# Patient Record
Sex: Female | Born: 1990 | Hispanic: Yes | Marital: Married | Smoking: Never smoker
Health system: Southern US, Community
[De-identification: ages and names within clinical notes are randomized; demographics above are authoritative.]

## PROBLEM LIST (undated history)

## (undated) DIAGNOSIS — Z789 Other specified health status: Secondary | ICD-10-CM

## (undated) DIAGNOSIS — T7840XA Allergy, unspecified, initial encounter: Secondary | ICD-10-CM

## (undated) DIAGNOSIS — K219 Gastro-esophageal reflux disease without esophagitis: Secondary | ICD-10-CM

## (undated) DIAGNOSIS — F419 Anxiety disorder, unspecified: Secondary | ICD-10-CM

## (undated) DIAGNOSIS — O039 Complete or unspecified spontaneous abortion without complication: Secondary | ICD-10-CM

## (undated) HISTORY — DX: Complete or unspecified spontaneous abortion without complication: O03.9

## (undated) HISTORY — DX: Allergy, unspecified, initial encounter: T78.40XA

## (undated) HISTORY — DX: Anxiety disorder, unspecified: F41.9

## (undated) HISTORY — PX: APPENDECTOMY: SHX54

---

## 2016-10-12 LAB — OB RESULTS CONSOLE ABO/RH: RH Type: POSITIVE

## 2016-10-12 LAB — OB RESULTS CONSOLE RUBELLA ANTIBODY, IGM: RUBELLA: IMMUNE

## 2016-10-12 LAB — OB RESULTS CONSOLE ANTIBODY SCREEN: ANTIBODY SCREEN: NEGATIVE

## 2016-10-12 LAB — OB RESULTS CONSOLE GC/CHLAMYDIA
CHLAMYDIA, DNA PROBE: NEGATIVE
Gonorrhea: NEGATIVE

## 2016-10-12 LAB — OB RESULTS CONSOLE HIV ANTIBODY (ROUTINE TESTING): HIV: NONREACTIVE

## 2016-10-12 LAB — OB RESULTS CONSOLE HEPATITIS B SURFACE ANTIGEN: HEP B S AG: NEGATIVE

## 2017-02-19 LAB — OB RESULTS CONSOLE RPR: RPR: NONREACTIVE

## 2017-04-13 LAB — OB RESULTS CONSOLE GBS: GBS: NEGATIVE

## 2017-05-01 ENCOUNTER — Inpatient Hospital Stay (HOSPITAL_COMMUNITY): Payer: BLUE CROSS/BLUE SHIELD | Admitting: Anesthesiology

## 2017-05-01 ENCOUNTER — Other Ambulatory Visit: Payer: Self-pay

## 2017-05-01 ENCOUNTER — Inpatient Hospital Stay (HOSPITAL_COMMUNITY)
Admission: AD | Admit: 2017-05-01 | Discharge: 2017-05-04 | DRG: 806 | Disposition: A | Discharge status: Home / Self Care | Payer: BLUE CROSS/BLUE SHIELD | Source: Ambulatory Visit | Attending: Obstetrics and Gynecology | Admitting: Obstetrics and Gynecology

## 2017-05-01 ENCOUNTER — Encounter (HOSPITAL_COMMUNITY): Payer: Self-pay | Admitting: *Deleted

## 2017-05-01 DIAGNOSIS — O4292 Full-term premature rupture of membranes, unspecified as to length of time between rupture and onset of labor: Secondary | ICD-10-CM | POA: Diagnosis present

## 2017-05-01 DIAGNOSIS — O9081 Anemia of the puerperium: Secondary | ICD-10-CM | POA: Diagnosis not present

## 2017-05-01 DIAGNOSIS — D62 Acute posthemorrhagic anemia: Secondary | ICD-10-CM | POA: Diagnosis not present

## 2017-05-01 DIAGNOSIS — Z3A39 39 weeks gestation of pregnancy: Secondary | ICD-10-CM | POA: Diagnosis not present

## 2017-05-01 HISTORY — DX: Other specified health status: Z78.9

## 2017-05-01 LAB — CBC
HEMATOCRIT: 37.4 % (ref 36.0–46.0)
Hemoglobin: 12.9 g/dL (ref 12.0–15.0)
MCH: 32.4 pg (ref 26.0–34.0)
MCHC: 34.5 g/dL (ref 30.0–36.0)
MCV: 94 fL (ref 78.0–100.0)
Platelets: 240 10*3/uL (ref 150–400)
RBC: 3.98 MIL/uL (ref 3.87–5.11)
RDW: 12.5 % (ref 11.5–15.5)
WBC: 11.7 10*3/uL — ABNORMAL HIGH (ref 4.0–10.5)

## 2017-05-01 LAB — TYPE AND SCREEN
ABO/RH(D): O POS
ANTIBODY SCREEN: NEGATIVE

## 2017-05-01 MED ORDER — LIDOCAINE HCL (PF) 1 % IJ SOLN
30.0000 mL | INTRAMUSCULAR | Status: DC | PRN
  Filled 2017-05-01: qty 30

## 2017-05-01 MED ORDER — EPHEDRINE 5 MG/ML INJ
10.0000 mg | INTRAVENOUS | Status: DC | PRN
  Filled 2017-05-01: qty 2

## 2017-05-01 MED ORDER — OXYCODONE-ACETAMINOPHEN 5-325 MG PO TABS: 1.0000 | ORAL_TABLET | ORAL | Status: DC | PRN

## 2017-05-01 MED ORDER — TERBUTALINE SULFATE 1 MG/ML IJ SOLN
0.2500 mg | Freq: Once | INTRAMUSCULAR | Status: DC | PRN
  Filled 2017-05-01: qty 1

## 2017-05-01 MED ORDER — LACTATED RINGERS IV SOLN
500.0000 mL | Freq: Once | INTRAVENOUS | Status: AC
  Administered 2017-05-01: 500 mL via INTRAVENOUS

## 2017-05-01 MED ORDER — OXYCODONE-ACETAMINOPHEN 5-325 MG PO TABS: 2.0000 | ORAL_TABLET | ORAL | Status: DC | PRN

## 2017-05-01 MED ORDER — LACTATED RINGERS IV SOLN: 500.0000 mL | INTRAVENOUS | Status: DC | PRN

## 2017-05-01 MED ORDER — OXYTOCIN BOLUS FROM INFUSION
500.0000 mL | Freq: Once | INTRAVENOUS | Status: AC
  Administered 2017-05-02: 500 mL via INTRAVENOUS

## 2017-05-01 MED ORDER — OXYTOCIN 40 UNITS IN LACTATED RINGERS INFUSION - SIMPLE MED
2.5000 [IU]/h | INTRAVENOUS | Status: DC
  Filled 2017-05-01: qty 1000

## 2017-05-01 MED ORDER — ACETAMINOPHEN 325 MG PO TABS: 650.0000 mg | ORAL_TABLET | ORAL | Status: DC | PRN

## 2017-05-01 MED ORDER — LACTATED RINGERS IV SOLN
INTRAVENOUS | Status: DC
  Administered 2017-05-01 – 2017-05-02 (×3): via INTRAVENOUS

## 2017-05-01 MED ORDER — PHENYLEPHRINE 40 MCG/ML (10ML) SYRINGE FOR IV PUSH (FOR BLOOD PRESSURE SUPPORT)
80.0000 ug | PREFILLED_SYRINGE | INTRAVENOUS | Status: DC | PRN
  Filled 2017-05-01: qty 5
  Filled 2017-05-01: qty 10

## 2017-05-01 MED ORDER — DIPHENHYDRAMINE HCL 50 MG/ML IJ SOLN: 12.5000 mg | INTRAMUSCULAR | Status: DC | PRN

## 2017-05-01 MED ORDER — OXYTOCIN 40 UNITS IN LACTATED RINGERS INFUSION - SIMPLE MED
1.0000 m[IU]/min | INTRAVENOUS | Status: DC
  Administered 2017-05-01: 2 m[IU]/min via INTRAVENOUS

## 2017-05-01 MED ORDER — SOD CITRATE-CITRIC ACID 500-334 MG/5ML PO SOLN: 30.0000 mL | ORAL | Status: DC | PRN

## 2017-05-01 MED ORDER — FENTANYL 2.5 MCG/ML BUPIVACAINE 1/10 % EPIDURAL INFUSION (WH - ANES)
14.0000 mL/h | INTRAMUSCULAR | Status: DC | PRN
  Administered 2017-05-01 – 2017-05-02 (×2): 14 mL/h via EPIDURAL
  Filled 2017-05-01 (×2): qty 100

## 2017-05-01 MED ORDER — ONDANSETRON HCL 4 MG/2ML IJ SOLN: 4.0000 mg | Freq: Four times a day (QID) | INTRAMUSCULAR | Status: DC | PRN

## 2017-05-01 MED ORDER — PHENYLEPHRINE 40 MCG/ML (10ML) SYRINGE FOR IV PUSH (FOR BLOOD PRESSURE SUPPORT)
80.0000 ug | PREFILLED_SYRINGE | INTRAVENOUS | Status: DC | PRN
  Filled 2017-05-01: qty 5

## 2017-05-01 MED ORDER — FLEET ENEMA 7-19 GM/118ML RE ENEM: 1.0000 | ENEMA | RECTAL | Status: DC | PRN

## 2017-05-01 MED ORDER — LIDOCAINE HCL (PF) 1 % IJ SOLN
INTRAMUSCULAR | Status: DC | PRN
  Administered 2017-05-01 (×2): 5 mL

## 2017-05-01 NOTE — Anesthesia Pain Management Evaluation Note (Signed)
  CRNA Pain Management Visit Note  Patient: Rosalita ChessmanMaria Mendoza Caballero, 27 y.o., female  "Hello I am a member of the anesthesia team at [facility]. We have an anesthesia team available at all times to provide care throughout the hospital, including epidural management and anesthesia for C-section. I don't know your plan for the delivery whether it a natural birth, water birth, IV sedation, nitrous supplementation, doula or epidural, but we want to meet your pain goals."   1.Was your pain managed to your expectations on prior hospitalizations?   No prior hospitalizations  2.What is your expectation for pain management during this hospitalization?     Epidural  3.How can we help you reach that goal? Epidural  Record the patient's initial score and the patient's pain goal.   Pain: 2  Pain Goal: 5 [facility] wants you to be able to say your pain was always managed very well.  Shelle Galdamez 05/01/2017

## 2017-05-01 NOTE — H&P (Signed)
Daisy ChessmanMaria Mendoza Ford is a 27 y.o. female G1P0 at 6539.0 wga presenting for srom at 1500 today, clear fluid and no bleeding.  She has had an uncomplicated antepartum course and notes +FM. [facility] at [facility] since 10 wks History OB History    Gravida Para Term Preterm AB Living   1             SAB TAB Ectopic Multiple Live Births                 Past Medical History:  Diagnosis Date  . Medical history non-contributory    Past Surgical History:  Procedure Laterality Date  . APPENDECTOMY     Family History: family history is not on file. Social History:  reports that  has never smoked. she has never used smokeless tobacco. She reports that she does not drink alcohol or use drugs.  ROS  Dilation: 1.5 Effacement (%): 100 Station: -2 Exam by:: e. poore, rnc Blood pressure 112/72, pulse 74, temperature 97.9 F (36.6 C), temperature source Oral, resp. rate 17, height 5\' 5"  (1.651 m), weight 154 lb (69.9 kg). Exam Physical Exam  Prenatal labs: ABO, Rh: --/--/O POS (02/23 1946) Antibody: NEG (02/23 1946) Rubella: Immune (08/06 0000) RPR: Nonreactive (12/14 0000)  HBsAg: Negative (08/06 0000)  HIV: Non-reactive (08/06 0000)  GBS: Negative (02/05 0000)  1 hr Glucola - passed Genetic screening ; nml quad Anatomy US: normal  Physical Exam:  A&O x 3 HEENT : grossly wnl Lungs : ctab CV : rrr Abdo : soft, nt, gravid; efw 7lbs Extr : no edema, nt bilat LE Pelvic : 2-3/70/-3, midposition to posterior  Results for orders placed or performed during the hospital encounter of 05/01/17 (from the past 24 hour(s))  CBC     Status: Abnormal   Collection Time: 05/01/17  7:46 PM  Result Value Ref Range   WBC 11.7 (H) 4.0 - 10.5 K/uL   RBC 3.98 3.87 - 5.11 MIL/uL   Hemoglobin 12.9 12.0 - 15.0 g/dL   HCT 62.937.4 52.836.0 - 41.346.0 %   MCV 94.0 78.0 - 100.0 fL   MCH 32.4 26.0 - 34.0 pg   MCHC 34.5 30.0 - 36.0 g/dL   RDW 24.412.5 01.011.5 - 27.215.5 %   Platelets 240 150 - 400 K/uL  Type and screen  [facility]     Status: None   Collection Time: 05/01/17  7:46 PM  Result Value Ref Range   ABO/RH(D) O POS    Antibody Screen NEG    Sample Expiration      05/04/2017 Performed at [facility], [address] [place], [place] [address]    FHT: 120s, normal variability, +accels, no decels TOCO: initially q 4, irregular; now more q 2-3   Assessment/Plan: iup at 39 wga 1. SROM - admit and continue pitocin augmentation; wants epidural; plan svd 2. gbs neg 3. Rh pos 4. Rubella immune   Vick FreesSusan E Saafir Abdullah 05/01/2017, 10:42 PM

## 2017-05-01 NOTE — MAU Provider Note (Signed)
   History   [id number]   Chief Complaint  Patient presents with  . Rupture of Membranes    HPI Daisy Ford is a 27 y.o. female  G1P0 @39 .0 wks here with report of LOF, clear around 1500.  Leaking of fluid has continued. Pt reports rare contractions. She denies vaginal bleeding. Last intercourse was 2 days ago. She reports good fetal movement. All other systems negative.    No LMP recorded. Patient is pregnant.  OB History  Gravida Para Term Preterm AB Living  1            SAB TAB Ectopic Multiple Live Births               # Outcome Date GA Lbr Len/2nd Weight Sex Delivery Anes PTL Lv  1 Current               Past Medical History:  Diagnosis Date  . Medical history non-contributory     History reviewed. No pertinent family history.  Social History   Socioeconomic History  . Marital status: Single    Spouse name: None  . Number of children: None  . Years of education: None  . Highest education level: None  Social Needs  . Financial resource strain: None  . Food insecurity - worry: None  . Food insecurity - inability: None  . Transportation needs - medical: None  . Transportation needs - non-medical: None  Occupational History  . None  Tobacco Use  . Smoking status: Never Smoker  . Smokeless tobacco: Never Used  Substance and Sexual Activity  . Alcohol use: No    Frequency: Never  . Drug use: No  . Sexual activity: Yes  Other Topics Concern  . None  Social History Narrative  . None    Allergies  Allergen Reactions  . Caffeine Palpitations    No current facility-administered medications on file prior to encounter.    No current outpatient medications on file prior to encounter.     Review of Systems  Gastrointestinal: Negative for abdominal pain.  Genitourinary: Positive for vaginal discharge.     Physical Exam   Vitals:   05/01/17 1830  BP: 132/83  Pulse: 86  Resp: 18  Temp: 98.7 F (37.1 C)  TempSrc: Oral     Physical Exam  Nursing note and vitals reviewed. Constitutional: She is oriented to person, place, and time. She appears well-developed and well-nourished. No distress.  HENT:  Head: Normocephalic and atraumatic.  Neck: Normal range of motion.  Cardiovascular: Normal rate.  Respiratory: No respiratory distress.  Genitourinary:  Genitourinary Comments: SSE: +pool, fern pos  Musculoskeletal: Normal range of motion.  Neurological: She is alert and oriented to person, place, and time.  Skin: Skin is warm and dry.  Psychiatric: She has a normal mood and affect.  EFM: 135 bpm, mod variability, + accels, no decels Toco: 4-6  MAU Course  Procedures  MDM PROM at term confirmed. Plan for admit.  Assessment and Plan   1. Full-term premature rupture of membranes, unspecified duration to onset of labor   2. [redacted] weeks gestation of pregnancy    [facility] Dr. Wallene DalesAlmquist   Namrata Dangler, AnnandaleMelanie, CNM 05/01/2017 7:06 PM

## 2017-05-01 NOTE — Anesthesia Preprocedure Evaluation (Signed)

## 2017-05-01 NOTE — Anesthesia Procedure Notes (Signed)
Epidural Patient location during procedure: OB  Staffing Anesthesiologist: Ottis Sarnowski, MD Performed: anesthesiologist   Preanesthetic Checklist Completed: patient identified, site marked, surgical consent, pre-op evaluation, timeout performed, IV checked, risks and benefits discussed and monitors and equipment checked  Epidural Patient position: sitting Prep: DuraPrep Patient monitoring: heart rate, continuous pulse ox and blood pressure Approach: right paramedian Location: L3-L4 Injection technique: LOR saline  Needle:  Needle type: Tuohy  Needle gauge: 17 G Needle length: 9 cm and 9 Needle insertion depth: 6 cm Catheter type: closed end flexible Catheter size: 20 Guage Catheter at skin depth: 10 cm Test dose: negative  Assessment Events: blood not aspirated, injection not painful, no injection resistance, negative IV test and no paresthesia  Additional Notes Patient identified. Risks/Benefits/Options discussed with patient including but not limited to bleeding, infection, nerve damage, paralysis, failed block, incomplete pain control, headache, blood pressure changes, nausea, vomiting, reactions to medication both or allergic, itching and postpartum back pain. Confirmed with bedside nurse the patient's most recent platelet count. Confirmed with patient that they are not currently taking any anticoagulation, have any bleeding history or any family history of bleeding disorders. Patient expressed understanding and wished to proceed. All questions were answered. Sterile technique was used throughout the entire procedure. Please see nursing notes for vital signs. Test dose was given through epidural needle and negative prior to continuing to dose epidural or start infusion. Warning signs of high block given to the patient including shortness of breath, tingling/numbness in hands, complete motor block, or any concerning symptoms with instructions to call for help. Patient was given  instructions on fall risk and not to get out of bed. All questions and concerns addressed with instructions to call with any issues.     

## 2017-05-01 NOTE — MAU Note (Signed)
Pt presents to MAU with c/o PROM. Pt felt small gush of fluid today around 1530, and has had leaking since then along with abdominal cramping. Pt denies VB. +FM

## 2017-05-02 ENCOUNTER — Encounter (HOSPITAL_COMMUNITY): Payer: Self-pay

## 2017-05-02 LAB — RPR: RPR: NONREACTIVE

## 2017-05-02 LAB — ABO/RH: ABO/RH(D): O POS

## 2017-05-02 MED ORDER — DIPHENHYDRAMINE HCL 25 MG PO CAPS: 25.0000 mg | ORAL_CAPSULE | Freq: Four times a day (QID) | ORAL | Status: DC | PRN

## 2017-05-02 MED ORDER — ONDANSETRON HCL 4 MG/2ML IJ SOLN: 4.0000 mg | INTRAMUSCULAR | Status: DC | PRN

## 2017-05-02 MED ORDER — OXYCODONE-ACETAMINOPHEN 5-325 MG PO TABS
1.0000 | ORAL_TABLET | ORAL | Status: DC | PRN
  Administered 2017-05-03: 1 via ORAL
  Filled 2017-05-02: qty 1

## 2017-05-02 MED ORDER — IBUPROFEN 600 MG PO TABS
600.0000 mg | ORAL_TABLET | Freq: Four times a day (QID) | ORAL | Status: DC
  Administered 2017-05-02 – 2017-05-04 (×6): 600 mg via ORAL
  Filled 2017-05-02 (×7): qty 1

## 2017-05-02 MED ORDER — WITCH HAZEL-GLYCERIN EX PADS: 1.0000 "application " | MEDICATED_PAD | CUTANEOUS | Status: DC | PRN

## 2017-05-02 MED ORDER — OXYCODONE-ACETAMINOPHEN 5-325 MG PO TABS: 2.0000 | ORAL_TABLET | ORAL | Status: DC | PRN

## 2017-05-02 MED ORDER — ACETAMINOPHEN 325 MG PO TABS
650.0000 mg | ORAL_TABLET | ORAL | Status: DC | PRN
  Administered 2017-05-04: 650 mg via ORAL
  Filled 2017-05-02: qty 2

## 2017-05-02 MED ORDER — SIMETHICONE 80 MG PO CHEW: 80.0000 mg | CHEWABLE_TABLET | ORAL | Status: DC | PRN

## 2017-05-02 MED ORDER — COCONUT OIL OIL
1.0000 "application " | TOPICAL_OIL | Status: DC | PRN
  Filled 2017-05-02: qty 120

## 2017-05-02 MED ORDER — SENNOSIDES-DOCUSATE SODIUM 8.6-50 MG PO TABS
2.0000 | ORAL_TABLET | ORAL | Status: DC
  Administered 2017-05-02 – 2017-05-04 (×2): 2 via ORAL
  Filled 2017-05-02 (×2): qty 2

## 2017-05-02 MED ORDER — PRENATAL MULTIVITAMIN CH
1.0000 | ORAL_TABLET | Freq: Every day | ORAL | Status: DC
  Administered 2017-05-03: 1 via ORAL
  Filled 2017-05-02: qty 1

## 2017-05-02 MED ORDER — BENZOCAINE-MENTHOL 20-0.5 % EX AERO
1.0000 "application " | INHALATION_SPRAY | CUTANEOUS | Status: DC | PRN
  Administered 2017-05-03: 1 via TOPICAL
  Filled 2017-05-02: qty 56

## 2017-05-02 MED ORDER — ONDANSETRON HCL 4 MG PO TABS: 4.0000 mg | ORAL_TABLET | ORAL | Status: DC | PRN

## 2017-05-02 MED ORDER — DIBUCAINE 1 % RE OINT
1.0000 "application " | TOPICAL_OINTMENT | RECTAL | Status: DC | PRN
  Administered 2017-05-03: 1 via RECTAL
  Filled 2017-05-02: qty 28

## 2017-05-02 NOTE — Progress Notes (Signed)
Delivery Note At  a viable and healthy female was delivered over intact perineum via svd (Presentation: ; roa ).  APGAR: , 9/9; weight: not yet done  .   Placenta status: delivered, intact .  Cord: 3vv  with the following complications: none.  Anesthesia:  epidural Episiotomy:  none Lacerations:  2nd degree; bilat vaginal with left labial; left vaginal repair initially not hemostatic and blood expressed from incision - further repair with 3-0 vicryl and then hemostatic, no blood expressed and not expanding; attention then drawn to perineum which was then repaired; left laceration then re-examined and hemostatic still Suture Repair: 3.0 vicryl Est. Blood Loss (mL):  400ml  Mom to postpartum.  Baby to Couplet care / Skin to Skin.  Vick FreesSusan E Almquist 05/02/2017, 2:23 PM

## 2017-05-02 NOTE — Lactation Note (Signed)
This note was copied from a baby's chart. Lactation Consultation Note  Patient Name: Daisy Ford Today's Date: 05/02/2017 Reason for consult: Initial assessment;Primapara;1st time breastfeeding;Term  5 hours old infant who is still being exclusively BF by his mother, her feeding choice upon admission was to BF and bottle fed, she's a P1. RN called for assistance because mom had requested formula already, spoke to mom about the consequences of formula feeding and size of newborn stomach.  Taught mom how to hand express, and she got reassured when she was able to express 3 ml of colostrum and LC fed it back to baby, taught parents how to spoon feed baby. After baby was done, assessed latch, put him STS on mother's breast (parents had baby on 2 blankets) and he was able to latch with very little effort and sustain the latch; he was still nursing when exiting the room, at least a 15 minutes feeding.  Spoke to parents about the importance of STS contact and encouraged them to feed baby on cues instead of on schedule at least 8-12 times/24 hours. Reviewed BF brochure (SP), BF resources and feeding diary, mom is aware of LC services and will call PRN.  Maternal Data Formula Feeding for Exclusion: Yes Reason for exclusion: Mother's choice to formula and breast feed on admission Has patient been taught Hand Expression?: Yes Does the patient have breastfeeding experience prior to this delivery?: No  Feeding Feeding Type: Breast Fed Length of feed: 15 min(baby still feeding when exiting the room)  LATCH Score Latch: Grasps breast easily, tongue down, lips flanged, rhythmical sucking.  Audible Swallowing: A few with stimulation  Type of Nipple: Everted at rest and after stimulation  Comfort (Breast/Nipple): Soft / non-tender  Hold (Positioning): Assistance needed to correctly position infant at breast and maintain latch.  LATCH Score: 8  Interventions Interventions: Breast  feeding basics reviewed;Assisted with latch;Skin to skin;Breast massage;Breast compression;Adjust position;Position options;Support pillows;Expressed milk  Lactation Tools Discussed/Used WIC Program: No   Consult Status Consult Status: Follow-up Date: 05/03/17 Follow-up type: In-patient    Glenda Spelman Venetia ConstableS Deannie Resetar 05/02/2017, 6:11 PM

## 2017-05-02 NOTE — Anesthesia Postprocedure Evaluation (Signed)
Anesthesia Post Note  Patient: Daisy ChessmanMaria Mendoza Ford  Procedure(s) Performed: AN AD HOC LABOR EPIDURAL     Patient location during evaluation: Mother Baby Anesthesia Type: Epidural Level of consciousness: awake Pain management: pain level controlled Vital Signs Assessment: post-procedure vital signs reviewed and stable Respiratory status: spontaneous breathing Cardiovascular status: stable Postop Assessment: no headache, no backache, epidural receding, patient able to bend at knees, no apparent nausea or vomiting and adequate PO intake Anesthetic complications: no    Last Vitals:  Vitals:   05/02/17 1500 05/02/17 1515  BP: 113/69 111/75  Pulse: 89 96  Resp: 18 18  Temp: 37.1 C   SpO2:      Last Pain:  Vitals:   05/02/17 1500  TempSrc: Oral  PainSc:    Pain Goal: Patients Stated Pain Goal: 5 (05/01/17 2019)               Marieke Lubke

## 2017-05-02 NOTE — Progress Notes (Signed)
Comfortable with epidural  Patient Vitals for the past 24 hrs:  BP Temp Temp src Pulse Resp SpO2 Height Weight  05/02/17 1000 113/67 - - 75 16 - - -  05/02/17 0930 119/75 98.3 F (36.8 C) Oral 73 16 - - -  05/02/17 0900 120/87 - - 92 16 - - -  05/02/17 0830 101/62 - - 77 16 - - -  05/02/17 0800 111/78 - - 74 16 - - -  05/02/17 0730 111/60 - - 62 16 - - -  05/02/17 0700 102/65 - - 69 16 - - -  05/02/17 0631 91/63 97.8 F (36.6 C) Oral 70 17 - - -  05/02/17 0601 102/61 - - 81 16 - - -  05/02/17 0531 (!) 99/58 - - 84 16 - - -  05/02/17 0501 112/68 - - 65 16 - - -  05/02/17 0431 98/61 97.6 F (36.4 C) Oral 66 16 - - -  05/02/17 0400 (!) 106/91 - - 77 16 - - -  05/02/17 0331 (!) 109/51 - - 78 16 - - -  05/02/17 0301 103/60 - - 93 16 - - -  05/02/17 0231 106/60 - - 82 17 - - -  05/02/17 0219 - 97.8 F (36.6 C) Oral - - - - -  05/02/17 0200 115/73 - - 73 16 - - -  05/02/17 0131 109/77 - - 72 16 - - -  05/02/17 0100 110/71 - - 72 17 - - -  05/02/17 0030 105/68 - - 87 16 - - -  05/02/17 0000 118/72 97.9 F (36.6 C) Oral 75 17 - - -  05/01/17 2350 - - - - - 98 % - -  05/01/17 2345 - - - - - 98 % - -  05/01/17 2340 113/76 - - 78 16 98 % - -  05/01/17 2335 111/83 - - 72 16 99 % - -  05/01/17 2330 118/70 - - 74 17 99 % - -  05/01/17 2325 110/72 - - 73 17 98 % - -  05/01/17 2320 116/74 - - 77 17 98 % - -  05/01/17 2315 125/73 - - 70 17 98 % - -  05/01/17 2311 121/69 - - 76 17 - - -  05/01/17 2310 - - - - - 98 % - -  05/01/17 2308 129/76 - - 79 17 - - -  05/01/17 2305 - - - - - 99 % - -  05/01/17 2304 122/63 - - 92 17 - - -  05/01/17 2227 123/77 - - 76 17 - - -  05/01/17 2153 - 97.9 F (36.6 C) Oral - - - - -  05/01/17 2152 112/72 - - 74 17 - - -  05/01/17 2118 113/71 - - 82 17 - - -  05/01/17 2017 - - - - - - 5\' 5"  (1.651 m) 154 lb (69.9 kg)  05/01/17 2006 112/73 98.5 F (36.9 C) Oral 84 16 - - -  05/01/17 1830 132/83 98.7 F (37.1 C) Oral 86 18 - - -   A&ox3 nml  respirations Abd: soft, nt, gravid Cx: 4/90/-1, cephalic; iupc placed LE: no edema, nt bilat  Fht: 130s, nml variability, +accelerations, no decelerations Toco: q1-2 min  A/p: iup at 39.1 1. srom - contin pitocin augmentation, appears to be approaching active labor; plan svd 2. Fetal status reassuring 3. gbs negative 4. Rh pos

## 2017-05-02 NOTE — Progress Notes (Signed)
Very comfortable with epidural  BP 118/80   Pulse 93   Temp 98.6 F (37 C) (Oral)   Resp 16   Ht 5\' 5"  (1.651 m)   Wt 154 lb (69.9 kg)   SpO2 98%   BMI 25.63 kg/m   A&ox3 nml respirations Abd: soft, nt, gravid Cx: c/c/+2 LE: no edema, nt bilat  FHT: 130s, normal variability, occ period of decreased variability; accels; few early decels, 1 late decel (about 40 sec) TOCO: q 2 min  A/p: iup at 39.1 1. Active labor - begin pushing , anticipate svd 2. Fetal status reassuring 3. gbs neg

## 2017-05-03 LAB — CBC
HCT: 30.9 % — ABNORMAL LOW (ref 36.0–46.0)
HEMOGLOBIN: 10.7 g/dL — AB (ref 12.0–15.0)
MCH: 32.5 pg (ref 26.0–34.0)
MCHC: 34.6 g/dL (ref 30.0–36.0)
MCV: 93.9 fL (ref 78.0–100.0)
Platelets: 188 10*3/uL (ref 150–400)
RBC: 3.29 MIL/uL — AB (ref 3.87–5.11)
RDW: 12.7 % (ref 11.5–15.5)
WBC: 15.9 10*3/uL — AB (ref 4.0–10.5)

## 2017-05-03 MED ORDER — POLYSACCHARIDE IRON COMPLEX 150 MG PO CAPS
150.0000 mg | ORAL_CAPSULE | Freq: Every day | ORAL | Status: DC
  Filled 2017-05-03: qty 1

## 2017-05-03 NOTE — Progress Notes (Signed)
Pacific iterpereter used to discuss circumcision timeline: here while in hospital with her provider, after d/c with list to be provided, or after 1 year with urologist. FOB stated that they wanted to discuss it for a few days. RN stated that she would provide them with outpatient list. FOB stated ok

## 2017-05-03 NOTE — Progress Notes (Signed)
Pacific iterpereter used to discuss circumcision timeline: here while in hospital with her provider, after d/c with list to be provided, or after 1 year with urologist. FOB stated that they wanted to discuss it for a few days. RN stated that she would provide them with outpatient list. FOB stated ok 

## 2017-05-03 NOTE — Lactation Note (Signed)
This note was copied from a baby's chart. Lactation Consultation Note  Patient Name: Daisy Ford [id number]'WToday's Date: 05/03/2017 Reason for consult: Follow-up assessment   Kennyth Loseacifica Interpreter [phone] for Spanish. P1, Baby 25 hours old.   Mother denies problems or questions and states breastfeeding is going well. Discussed breastfeeding on both breasts per feeding and cluster feeding. Mother latched baby in cradle hold.  Taught how to flange bottom lip and bring him in close. Intermittent sucks and swallows observed. Suggest mother call if she needs further assistance.     Maternal Data    Feeding Feeding Type: Breast Fed Length of feed: 15 min  LATCH Score Latch: Grasps breast easily, tongue down, lips flanged, rhythmical sucking.  Audible Swallowing: A few with stimulation  Type of Nipple: Everted at rest and after stimulation  Comfort (Breast/Nipple): Soft / non-tender  Hold (Positioning): Assistance needed to correctly position infant at breast and maintain latch.  LATCH Score: 8  Interventions    Lactation Tools Discussed/Used     Consult Status Consult Status: Follow-up Date: 05/04/17 Follow-up type: In-patient    Dahlia ByesBerkelhammer, Harlea Goetzinger [facility] 05/03/2017, 2:49 PM

## 2017-05-03 NOTE — Progress Notes (Signed)
PPD #1, SVD, 2nd degree, bilateral vaginal, left labial repair, baby boy "Engineer, maintenance (IT)Christopher" Interpreter present for full visit  S:  Reports feeling good, but sore              Tolerating po/ No nausea or vomiting / Denies dizziness or SOB             Bleeding is moderate             Pain controlled with Motrin and Percocet              Up ad lib / ambulatory / voiding QS  Newborn breast feeding - reports latching is going well and feels comfortable with positioning  / Circumcision - desires; but planning to call insurance today to determine most cost effective option  O:               VS: BP 104/61 (BP Location: Right Arm)   Pulse 78   Temp 97.7 F (36.5 C) (Oral)   Resp 17   Ht 5\' 5"  (1.651 m)   Wt 69.9 kg (154 lb)   SpO2 100%   Breastfeeding? Unknown   BMI 25.63 kg/m    LABS:              Recent Labs    05/01/17 1946 05/03/17 0507  WBC 11.7* 15.9*  HGB 12.9 10.7*  PLT 240 188               Blood type: --/--/O POS, O POS Performed at [facility], [address] [place], [place] [address]  [phone](02/23 1946)  Rubella: Immune (08/06 0000)                     I&O: Intake/Output      02/24 0701 - 02/25 0700 02/25 0701 - 02/26 0700   Urine (mL/kg/hr) 750 (0.4)    Blood 430    Total Output 1180    Net -1180                       Physical Exam:             Alert and oriented X3  Lungs: Clear and unlabored  Heart: regular rate and rhythm / no murmurs  Abdomen: soft, non-tender, non-distended              Fundus: firm, non-tender, U-1  Perineum: well approximated 2nd degree, bilateral vaginal, and left labial repair; mild edema, no erythema or ecchymosis   Lochia: moderate, no clots  Extremities: no edema, no calf pain or tenderness    A: PPD # 1, SVD  2nd degree, bilateral vaginal, left labial repair - healing well   Mild ABL Anemia   Doing well - stable status  P: Routine post partum orders  Begin Niferex 150mg  daily  See lactation today   Call insurance for  circumcision cost   Anticipate discharge home tomorrow   Carlean JewsMeredith Merrin Mcvicker, MSN, CNM [facility]

## 2017-05-04 MED ORDER — SENNOSIDES-DOCUSATE SODIUM 8.6-50 MG PO TABS: 2.0000 | ORAL_TABLET | ORAL | 0 refills | Status: DC

## 2017-05-04 MED ORDER — IBUPROFEN 600 MG PO TABS: 600.0000 mg | ORAL_TABLET | Freq: Four times a day (QID) | ORAL | 0 refills | Status: DC

## 2017-05-04 MED ORDER — FERROUS SULFATE 325 (65 FE) MG PO TABS: 325.0000 mg | ORAL_TABLET | Freq: Every day | ORAL | 3 refills | Status: DC

## 2017-05-04 NOTE — Lactation Note (Signed)
This note was copied from a baby's chart. Lactation Consultation Note  Patient Name: Boy Rosalita ChessmanMaria Mendoza Caballero [id number]'UToday's Date: 05/04/2017   Jaci LazierPacifica Interpreter [phone] used for Spanish. Mother breastfeeding upon entering.  Sucks and swallows observed. Mother concerned about her milk supply because her baby breastfed frequently through the night.  Provided education regarding cluster feeding and how milk comes to volume. Reassured mother this is normal feeding behavior. Reviewed hand expression with mother who can easily express flow of transitional breastmilk.  Mother appears happy. Provided mother w/ manual pump and demonstrated use. Mom encouraged to feed baby 8-12 times/24 hours and with feeding cues.  Reviewed engorgement care and monitoring voids/stools.      Maternal Data    Feeding Feeding Type: Breast Fed Length of feed: 25 min  LATCH Score                   Interventions    Lactation Tools Discussed/Used     Consult Status      Hardie PulleyBerkelhammer, Isais Klipfel Boschen 05/04/2017, 10:23 AM

## 2017-05-04 NOTE — Progress Notes (Signed)
PPD #2, SVD, 2nd degree, bilateral vaginal, left labial repair, baby boy "Engineer, maintenance (IT)Christopher" Interpreter present for full visit  S:  Reports feeling good, but tired - states baby was cluster feeding all night and was inconsolable at times - expresses that she is interested in starting some formula             Tolerating po/ No nausea or vomiting / Denies dizziness or SOB             Bleeding is light             Pain controlled with Motrin             Up ad lib / ambulatory / voiding QS  Newborn breastfeeding - latching well, but reports sore nipples on right side  / Circumcision - planning out patient   O:               VS: BP 105/64   Pulse 85   Temp 97.9 F (36.6 C) (Oral)   Resp 18   Ht 5\' 5"  (1.651 m)   Wt 69.9 kg (154 lb)   SpO2 100%   Breastfeeding? Unknown   BMI 25.63 kg/m    LABS:              Recent Labs    05/01/17 1946 05/03/17 0507  WBC 11.7* 15.9*  HGB 12.9 10.7*  PLT 240 188               Blood type: --/--/O POS, O POS Performed at [facility], [address] [place], [place] [address]  [phone](02/23 1946)  Rubella: Immune (08/06 0000)                     I&O: Intake/Output      02/25 0701 - 02/26 0700 02/26 0701 - 02/27 0700   Urine (mL/kg/hr)     Blood     Total Output     Net                        Physical Exam:             Alert and oriented X3  Breast: right nipple mild erythema noted; no bleeding or cracking noted; left nipple WNL  Abdomen: soft, non-tender, non-distended              Fundus: firm, non-tender, U-3  Perineum: well approximated 2nd degree, bilateral vaginal, and left labial repair; mild edema, no erythema or ecchymosis   Lochia: scant, no clots   Extremities: no edema, no calf pain or tenderness    A: PPD # 2, SVD             2nd degree, bilateral vaginal, left labial repair - healing well              Mild ABL Anemia - stable on oral FE             Doing well - stable status  P: Routine post partum orders  RN notified that  patient desires to begin some formula supplementation, need for coconut oil and comfort gels, and [employer] nurse visit in 1 week  Discharge home today  [facility] discharge book and instructions reviewed via interpreter, Veryl SpeakEtta  Plans for circ outpatient   F/u [facility] Dr. Ernestina [facility]naFogleman [facility]  Carlean JewsMeredith Garreth Burnsworth, MSN, CNM [facility]

## 2017-05-04 NOTE — Discharge Summary (Signed)
Obstetric Discharge Summary   Patient Name: Daisy Ford DOB: Jun 28, 1990 MRN: [id number]  Date of Admission: 05/01/2017 Date of Discharge: 05/04/2017 Date of Delivery: 05/02/2017 Gestational Age at Delivery: [redacted]w[redacted]d  Primary OB: [facility] - Dr. Ernestina Penna   Antepartum complications: none; Spanish-speaking Prenatal Labs:  ABO, Rh: --/--/O POS (02/23 1946) Antibody: NEG (02/23 1946) Rubella: Immune (08/06 0000) RPR: Nonreactive (12/14 0000)  HBsAg: Negative (08/06 0000)  HIV: Non-reactive (08/06 0000)  GBS: Negative (02/05 0000)  1 hr Glucola - passed Genetic screening ; nml quad Anatomy US: normal  Admitting Diagnosis: SROM at term   Secondary Diagnoses: Patient Active Problem List   Diagnosis Date Noted  . SVD (spontaneous vaginal delivery) 05/03/2017  . Postpartum care following vaginal delivery (2/24) 05/03/2017  . Second degree perineal laceration 05/03/2017  . Normal labor 05/01/2017    Augmentation: Pitocin  Complications: None  Date of Delivery: 05/02/2017 Delivered By: Dr. Amado Nash Delivery Type: spontaneous vaginal delivery Anesthesia: epidural Placenta: sponatneous Laceration: 2nd degree; bilat vaginal with left labial; left vaginal  Episiotomy: none  Newborn Data: Live born female  Birth Weight: 7 lb 4.2 oz (3295 g) APGAR: 9, 9  Newborn Delivery   Birth date/time:  05/02/2017 13:06:00 Delivery type:  Vaginal, Spontaneous     Hospital/Postpartum Course  (Vaginal Delivery): Pt. Admitted with SROM at term.  She was augmented with Pitocin and progressed normally.  See delivery note for further details. Patient had an uncomplicated postpartum course.  By time of discharge on PPD#2, her pain was controlled on oral pain medications; she had appropriate lochia and was ambulating, voiding without difficulty and tolerating regular diet.  She was deemed stable for discharge to home.    Labs: CBC Latest Ref Rng & Units 05/03/2017 05/01/2017  WBC 4.0  - 10.5 K/uL 15.9(H) 11.7(H)  Hemoglobin 12.0 - 15.0 g/dL 10.7(L) 12.9  Hematocrit 36.0 - 46.0 % 30.9(L) 37.4  Platelets 150 - 400 K/uL 188 240   Conflict (See Lab Report): O POS/O POS Performed at [facility], [address] [place], [place] [address]   Physical exam:  BP 105/64   Pulse 85   Temp 97.9 F (36.6 C) (Oral)   Resp 18   Ht 5\' 5"  (1.651 m)   Wt 69.9 kg (154 lb)   SpO2 100%   Breastfeeding? Unknown   BMI 25.63 kg/m  General: alert and no distress Pulm: normal respiratory effort Lochia: appropriate Abdomen: soft, NT Uterine Fundus: firm, below umbilicus Perineum: healing well, no significant erythema, no significant edema Incision: c/d/i, healing well, no significant drainage, no dehiscence, no significant erythema Extremities: No evidence of DVT seen on physical exam. No lower extremity edema.  Disposition: stable, discharge to home Baby Feeding: breast milk and formula Baby Disposition: home with mom  Contraception: unsure  Rh Immune globulin given: N/A Rubella vaccine given: N/A Tdap vaccine given in AP or PP setting: UTD Flu vaccine given in AP or PP setting: UTD   Plan:  Daisy Ford was discharged to home in good condition. Follow-up appointment at [facility] in 6 weeks.  Discharge Instructions: Per After Visit Summary. Refer to After Visit Summary and [facility] discharge booklet. Interpreter used for discharge instructions.  Activity: Advance as tolerated. Pelvic rest for 6 weeks.   Diet: Regular, Heart Healthy Discharge Medications: Allergies as of 05/04/2017   No Known Allergies     Medication List    TAKE these medications   ferrous sulfate 325 (65 FE) MG tablet Take  1 tablet (325 mg total) by mouth daily.   ibuprofen 600 MG tablet Commonly known as:  ADVIL,MOTRIN Take 1 tablet (600 mg total) by mouth every 6 (six) hours.   prenatal multivitamin Tabs tablet Take 1 tablet by mouth daily at 12 noon.    senna-docusate 8.6-50 MG tablet Commonly known as:  Senokot-S Take 2 tablets by mouth daily. Start taking on:  05/05/2017            Discharge Care Instructions  (From admission, onward)        Start     Ordered   05/04/17 0000  Discharge wound care:    Comments:  Recommend sitz baths 3-5 times per day   05/04/17 0950     Outpatient follow up:  Follow-up Information    Noland FordyceFogleman, Kelly, MD. Schedule an appointment as soon as possible for a visit in 6 week(s).   Specialty:  Obstetrics and Gynecology Why:  Postpartum visit  Contact information: [address] [place] [id number] [phone]           Signed:  Carlean JewsMeredith Grantland Want, MSN, CNM [facility]

## 2019-07-03 ENCOUNTER — Other Ambulatory Visit (HOSPITAL_COMMUNITY)
Admission: RE | Admit: 2019-07-03 | Discharge: 2019-07-03 | Disposition: A | Discharge status: Home / Self Care | Payer: Medicaid Other | Source: Ambulatory Visit | Attending: Adult Health | Admitting: Adult Health

## 2019-07-03 ENCOUNTER — Other Ambulatory Visit: Payer: Self-pay

## 2019-07-03 ENCOUNTER — Encounter: Payer: Self-pay | Admitting: Adult Health

## 2019-07-03 ENCOUNTER — Ambulatory Visit (INDEPENDENT_AMBULATORY_CARE_PROVIDER_SITE_OTHER): Payer: Medicaid Other | Admitting: Adult Health

## 2019-07-03 VITALS — BP 113/69 | HR 76 | Ht 65.5 in | Wt 144.0 lb

## 2019-07-03 DIAGNOSIS — Z1151 Encounter for screening for human papillomavirus (HPV): Secondary | ICD-10-CM | POA: Diagnosis not present

## 2019-07-03 DIAGNOSIS — Z01419 Encounter for gynecological examination (general) (routine) without abnormal findings: Secondary | ICD-10-CM | POA: Insufficient documentation

## 2019-07-03 DIAGNOSIS — Z603 Acculturation difficulty: Secondary | ICD-10-CM | POA: Diagnosis not present

## 2019-07-03 DIAGNOSIS — Z0000 Encounter for general adult medical examination without abnormal findings: Secondary | ICD-10-CM

## 2019-07-03 NOTE — Progress Notes (Signed)
Patient ID: Daisy Ford, female   DOB: 1990/12/12, 29 y.o.   MRN: [id number] History of Present Illness: Daisy Ford is a 29 year old Hispanic female, married, G2P1011, in for well woman gyn exam and pap.joanna the interrupter is with her.    Current Medications, Allergies, Past Medical History, Past Surgical History, Family History and Social History were reviewed in [employer] record.     Review of Systems:  Patient denies any headaches, hearing loss, fatigue, blurred vision, shortness of breath, chest pain, abdominal pain, problems with bowel movements, urination, or intercourse(ocassonal pain, ?postional). No joint pain or mood swings. She is not on birth control, it caused brown spots on face,like when pregnant, so stopped, it is OK if gets pregnant.  Physical Exam:BP 113/69 (BP Location: Left Arm, Patient Position: Sitting, Cuff Size: Normal)   Pulse 76   Ht 5' 5.5" (1.664 m)   Wt 144 lb (65.3 kg)   LMP 06/16/2019 (Exact Date)   Breastfeeding No   BMI 23.60 kg/m  General:  Well developed, well nourished, no acute distress Skin:  Warm and dry Neck:  Midline trachea, normal thyroid, good ROM, no lymphadenopathy Lungs; Clear to auscultation bilaterally Breast:  No dominant palpable mass, retraction, or nipple discharge Cardiovascular: Regular rate and rhythm Abdomen:  Soft, non tender, no hepatosplenomegaly Pelvic:  External genitalia is normal in appearance, no lesions.  The vagina is normal in appearance. Urethra has no lesions or masses. The cervix is bulbous.Pap with GC/CHL and high risk HPV 16/18 genotyping.  Uterus is felt to be normal size, shape, and contour.  No adnexal masses or tenderness noted.Bladder is non tender, no masses felt Extremities/musculoskeletal:  No swelling or varicosities noted, no clubbing or cyanosis Psych:  No mood changes, alert and cooperative,seems happy AA 0 Fall risk is low PHQ 9 score is 0.  Examination chaperoned  by Faith Rogue LPN  Impression and Plan: 1. Encounter for gynecological examination with Papanicolaou smear of cervix Pap sent  Physical in 1 year Pap in 3 if normal Did discuss para guard IUD as non hormonal option, not interested.

## 2019-07-05 LAB — CYTOLOGY - PAP
Chlamydia: NEGATIVE
Comment: NEGATIVE
Comment: NEGATIVE
Comment: NORMAL
Diagnosis: NEGATIVE
High risk HPV: NEGATIVE
Neisseria Gonorrhea: NEGATIVE

## 2019-09-18 ENCOUNTER — Other Ambulatory Visit: Payer: Self-pay | Admitting: Obstetrics & Gynecology

## 2019-09-18 DIAGNOSIS — O3680X0 Pregnancy with inconclusive fetal viability, not applicable or unspecified: Secondary | ICD-10-CM

## 2019-09-19 ENCOUNTER — Other Ambulatory Visit: Payer: Medicaid Other

## 2019-09-19 ENCOUNTER — Ambulatory Visit (INDEPENDENT_AMBULATORY_CARE_PROVIDER_SITE_OTHER): Payer: Medicaid Other

## 2019-09-19 ENCOUNTER — Other Ambulatory Visit: Payer: Self-pay

## 2019-09-19 DIAGNOSIS — Z3490 Encounter for supervision of normal pregnancy, unspecified, unspecified trimester: Secondary | ICD-10-CM

## 2019-09-19 DIAGNOSIS — O3680X0 Pregnancy with inconclusive fetal viability, not applicable or unspecified: Secondary | ICD-10-CM

## 2019-09-19 DIAGNOSIS — Z3A09 9 weeks gestation of pregnancy: Secondary | ICD-10-CM

## 2019-09-19 NOTE — Progress Notes (Signed)
[place] 6+1 wks single IUP,no FHT visualized,GS 28.9 mm,normal ovaries,pt will come back for f/u ultrasound in 10 days,labs ordered today per Victorino Dike

## 2019-09-20 LAB — BETA HCG QUANT (REF LAB): hCG Quant: 4279 m[IU]/mL

## 2019-09-21 ENCOUNTER — Encounter (HOSPITAL_COMMUNITY): Payer: Self-pay | Admitting: Obstetrics & Gynecology

## 2019-09-21 ENCOUNTER — Other Ambulatory Visit: Payer: Self-pay

## 2019-09-21 ENCOUNTER — Inpatient Hospital Stay (HOSPITAL_COMMUNITY): Payer: Medicaid Other

## 2019-09-21 ENCOUNTER — Inpatient Hospital Stay (HOSPITAL_COMMUNITY)
Admission: AD | Admit: 2019-09-21 | Discharge: 2019-09-21 | Disposition: A | Discharge status: Home / Self Care | Payer: Medicaid Other | Attending: Obstetrics & Gynecology | Admitting: Obstetrics & Gynecology

## 2019-09-21 DIAGNOSIS — O3680X0 Pregnancy with inconclusive fetal viability, not applicable or unspecified: Secondary | ICD-10-CM | POA: Diagnosis not present

## 2019-09-21 DIAGNOSIS — N83201 Unspecified ovarian cyst, right side: Secondary | ICD-10-CM | POA: Diagnosis not present

## 2019-09-21 DIAGNOSIS — O209 Hemorrhage in early pregnancy, unspecified: Secondary | ICD-10-CM | POA: Diagnosis not present

## 2019-09-21 DIAGNOSIS — O3680X1 Pregnancy with inconclusive fetal viability, fetus 1: Secondary | ICD-10-CM

## 2019-09-21 DIAGNOSIS — O3481 Maternal care for other abnormalities of pelvic organs, first trimester: Secondary | ICD-10-CM | POA: Insufficient documentation

## 2019-09-21 DIAGNOSIS — Z3A09 9 weeks gestation of pregnancy: Secondary | ICD-10-CM

## 2019-09-21 DIAGNOSIS — O4691 Antepartum hemorrhage, unspecified, first trimester: Secondary | ICD-10-CM | POA: Diagnosis not present

## 2019-09-21 DIAGNOSIS — Z6790 Unspecified blood type, Rh positive: Secondary | ICD-10-CM

## 2019-09-21 DIAGNOSIS — Z3490 Encounter for supervision of normal pregnancy, unspecified, unspecified trimester: Secondary | ICD-10-CM

## 2019-09-21 LAB — CBC
HCT: 43.7 % (ref 36.0–46.0)
Hemoglobin: 14.9 g/dL (ref 12.0–15.0)
MCH: 31.4 pg (ref 26.0–34.0)
MCHC: 34.1 g/dL (ref 30.0–36.0)
MCV: 92.2 fL (ref 80.0–100.0)
Platelets: 334 10*3/uL (ref 150–400)
RBC: 4.74 MIL/uL (ref 3.87–5.11)
RDW: 12.5 % (ref 11.5–15.5)
WBC: 10.2 10*3/uL (ref 4.0–10.5)
nRBC: 0 % (ref 0.0–0.2)

## 2019-09-21 LAB — URINALYSIS, ROUTINE W REFLEX MICROSCOPIC
Bacteria, UA: NONE SEEN
Bilirubin Urine: NEGATIVE
Glucose, UA: NEGATIVE mg/dL
Ketones, ur: NEGATIVE mg/dL
Leukocytes,Ua: NEGATIVE
Nitrite: NEGATIVE
Protein, ur: NEGATIVE mg/dL
Specific Gravity, Urine: 1.01 (ref 1.005–1.030)
pH: 6 (ref 5.0–8.0)

## 2019-09-21 LAB — HCG, QUANTITATIVE, PREGNANCY: hCG, Beta Chain, Quant, S: 3122 m[IU]/mL — ABNORMAL HIGH (ref <5)

## 2019-09-21 NOTE — MAU Provider Note (Addendum)
History     CSN: [id number]  Arrival date and time: 09/21/19 1830   First Provider Initiated Contact with Patient 09/21/19 2008      Chief Complaint  Patient presents with  . Vaginal Bleeding   HPI Daisy Ford is a 29 y.o. G3P1011 at [redacted]w[redacted]d who presents to MAU with chief complaint of vaginal spotting. This is a new problem, onset earlier today and occurring on only once. She also endorses new onset lower abdominal cramping, which she rates as 1/10. She denies aggravating or alleviating factors.   Patient had an early pregnancy evaluation at [facility]. She states she had an ultrasound but does not know the results.  OB History    Gravida  3   Para  1   Term  1   Preterm      AB  1   Living  1     SAB      TAB      Ectopic      Multiple  0   Live Births  1           Past Medical History:  Diagnosis Date  . Medical history non-contributory     Past Surgical History:  Procedure Laterality Date  . APPENDECTOMY      History reviewed. No pertinent family history.  Social History   Tobacco Use  . Smoking status: Never Smoker  . Smokeless tobacco: Never Used  Vaping Use  . Vaping Use: Never used  Substance Use Topics  . Alcohol use: No  . Drug use: No    Allergies:  Allergies  Allergen Reactions  . Caffeine Palpitations    Medications Prior to Admission  Medication Sig Dispense Refill Last Dose  . Prenatal Vit-Fe Fumarate-FA (PRENATAL MULTIVITAMIN) TABS tablet Take 1 tablet by mouth daily at 12 noon.       Review of Systems  Gastrointestinal: Positive for abdominal pain.  Genitourinary: Positive for vaginal bleeding.  All other systems reviewed and are negative.  Physical Exam   Blood pressure (!) 121/57, pulse 87, temperature 98.6 F (37 C), resp. rate 16, height 5\' 5"  (1.651 m), weight 64.9 kg, last menstrual period 07/16/2019.  Patient Vitals for the past 24 hrs:  BP Temp Pulse Resp Height Weight  09/21/19 1911  (!) 121/57 98.6 F (37 C) 87 16 5\' 5"  (1.651 m) 64.9 kg   Physical Exam Vitals and nursing note reviewed. Exam conducted with a chaperone present.  Cardiovascular:     Rate and Rhythm: Normal rate.     Pulses: Normal pulses.  Pulmonary:     Effort: Pulmonary effort is normal.  Abdominal:     General: Abdomen is flat.  Skin:    General: Skin is warm and dry.     Capillary Refill: Capillary refill takes less than 2 seconds.  Neurological:     Mental Status: She is alert.  Psychiatric:        Mood and Affect: Mood normal.        Behavior: Behavior normal.        Thought Content: Thought content normal.        Judgment: Judgment normal.    Results for orders placed or performed during the hospital encounter of 09/21/19 (from the past 24 hour(s))  Urinalysis, Routine w reflex microscopic     Status: Abnormal   Collection Time: 09/21/19  7:20 PM  Result Value Ref Range   Color, Urine YELLOW YELLOW   APPearance CLEAR  CLEAR   Specific Gravity, Urine 1.010 1.005 - 1.030   pH 6.0 5.0 - 8.0   Glucose, UA NEGATIVE NEGATIVE mg/dL   Hgb urine dipstick SMALL (A) NEGATIVE   Bilirubin Urine NEGATIVE NEGATIVE   Ketones, ur NEGATIVE NEGATIVE mg/dL   Protein, ur NEGATIVE NEGATIVE mg/dL   Nitrite NEGATIVE NEGATIVE   Leukocytes,Ua NEGATIVE NEGATIVE   RBC / HPF 6-10 0 - 5 RBC/hpf   WBC, UA 0-5 0 - 5 WBC/hpf   Bacteria, UA NONE SEEN NONE SEEN   Squamous Epithelial / LPF 0-5 0 - 5  CBC     Status: None   Collection Time: 09/21/19  8:11 PM  Result Value Ref Range   WBC 10.2 4.0 - 10.5 K/uL   RBC 4.74 3.87 - 5.11 MIL/uL   Hemoglobin 14.9 12.0 - 15.0 g/dL   HCT 31.5 36 - 46 %   MCV 92.2 80.0 - 100.0 fL   MCH 31.4 26.0 - 34.0 pg   MCHC 34.1 30.0 - 36.0 g/dL   RDW 17.6 16.0 - 73.7 %   Platelets 334 150 - 400 K/uL   nRBC 0.0 0.0 - 0.2 %  hCG, quantitative, pregnancy     Status: Abnormal   Collection Time: 09/21/19  8:11 PM  Result Value Ref Range   hCG, Beta Chain, Quant, S 3,122 (H) <5  mIU/mL   US OB LESS THAN 14 WEEKS WITH OB TRANSVAGINAL  Result Date: 09/21/2019 CLINICAL DATA:  Pregnant patient in first-trimester pregnancy with vaginal bleeding. Absent fetal heart tones with crown-rump length 6.1 mm on 06/13, now vaginal spotting. EXAM: OBSTETRIC <14 WK [place] AND TRANSVAGINAL OB US TECHNIQUE: Both transabdominal and transvaginal ultrasound examinations were performed for complete evaluation of the gestation as well as the maternal uterus, adnexal regions, and pelvic cul-de-sac. Transvaginal technique was performed to assess early pregnancy. COMPARISON:  Obstetric ultrasound 09/19/2019 FINDINGS: Intrauterine gestational sac: Single Yolk sac:  Visualized. Embryo:  No longer visualized. Cardiac Activity: Not Visualized. MSD: 23.3 mm   7 w   2 d Subchorionic hemorrhage:  None visualized. Maternal uterus/adnexae: The left ovary is well visualized and normal. There is a 1.7 cm simple cyst in the right ovary. No suspicious adnexal mass. No pelvic free fluid. IMPRESSION: 1. Intrauterine gestational sac with yolk sac. The embryo on prior exam is no longer seen. Findings meet definitive criteria for failed pregnancy. This follows SRU consensus guidelines: Diagnostic Criteria for Nonviable Pregnancy Early in the First Trimester. N Engl J Med [phone]. 2. Simple cyst in the right ovary measures 1.7 cm. Electronically Signed   By: Narda Rutherford M.D.   On: 09/21/2019 20:57   MAU Course  Procedures  --Viability scan at [facility] FT 09/19/2019. CRL 5.47mm with no fetal heart tones at that time. --Spanish language interpreter Nettie Elm present for my initial assessment and discussion of plan of care  Orders Placed This Encounter  Procedures  . US OB LESS THAN 14 WEEKS WITH OB TRANSVAGINAL  . Urinalysis, Routine w reflex microscopic  . CBC  . hCG, quantitative, pregnancy  . Discharge patient   Results for orders placed or performed during the hospital encounter of 09/21/19 (from the past 24  hour(s))  Urinalysis, Routine w reflex microscopic     Status: Abnormal   Collection Time: 09/21/19  7:20 PM  Result Value Ref Range   Color, Urine YELLOW YELLOW   APPearance CLEAR CLEAR   Specific Gravity, Urine 1.010 1.005 - 1.030   pH 6.0 5.0 -  8.0   Glucose, UA NEGATIVE NEGATIVE mg/dL   Hgb urine dipstick SMALL (A) NEGATIVE   Bilirubin Urine NEGATIVE NEGATIVE   Ketones, ur NEGATIVE NEGATIVE mg/dL   Protein, ur NEGATIVE NEGATIVE mg/dL   Nitrite NEGATIVE NEGATIVE   Leukocytes,Ua NEGATIVE NEGATIVE   RBC / HPF 6-10 0 - 5 RBC/hpf   WBC, UA 0-5 0 - 5 WBC/hpf   Bacteria, UA NONE SEEN NONE SEEN   Squamous Epithelial / LPF 0-5 0 - 5  CBC     Status: None   Collection Time: 09/21/19  8:11 PM  Result Value Ref Range   WBC 10.2 4.0 - 10.5 K/uL   RBC 4.74 3.87 - 5.11 MIL/uL   Hemoglobin 14.9 12.0 - 15.0 g/dL   HCT 16.143.7 36 - 46 %   MCV 92.2 80.0 - 100.0 fL   MCH 31.4 26.0 - 34.0 pg   MCHC 34.1 30.0 - 36.0 g/dL   RDW 09.612.5 04.511.5 - 40.915.5 %   Platelets 334 150 - 400 K/uL   nRBC 0.0 0.0 - 0.2 %  hCG, quantitative, pregnancy     Status: Abnormal   Collection Time: 09/21/19  8:11 PM  Result Value Ref Range   hCG, Beta Chain, Quant, S 3,122 (H) <5 mIU/mL   Report given to N. Felesia Stahlecker, NP who assumes care of patient at this time.  Clayton BiblesSamantha Weinhold, MSN, CNM Certified Nurse Midwife, Faculty Practice 09/21/19 8:57 PM   -UA: sm hgb -CBC: WNL -US: previously seen embryo no longer visualized, MSD 23.673mm, simple cyst on right ovary -hCG: pending at time of discharge -ABO: O Positive -consulted with Dr. Macon LargeAnyanwu, will consider this suspicious for, but not diagnostic of a failed pregnancy and f/u with [facility] for repeat US in one week -discussed with patient, patient reports this is a desired pregnancy and wishes to wait an additional week for repeat US -pt discharged to home in stable condition  Orders Placed This Encounter  Procedures  . US OB LESS THAN 14 WEEKS WITH OB TRANSVAGINAL     Absent fetal heart tones with CRL 6.1 on 06/13, now vaginal spotting    Standing Status:   Standing    Number of Occurrences:   1    Order Specific Question:   Symptom/Reason for Exam    Answer:   Vaginal bleeding in pregnancy, first trimester [[id number]]  . Urinalysis, Routine w reflex microscopic    Standing Status:   Standing    Number of Occurrences:   1  . CBC    Standing Status:   Standing    Number of Occurrences:   1  . hCG, quantitative, pregnancy    Standing Status:   Standing    Number of Occurrences:   1  . Discharge patient    Order Specific Question:   Discharge disposition    Answer:   01-Home or Self Care [1]    Order Specific Question:   Discharge patient date    Answer:   09/21/2019   No orders of the defined types were placed in this encounter.  Assessment and Plan   1. Pregnancy with uncertain fetal viability, fetus 1 of multiple gestation   2. Vaginal bleeding in pregnancy, first trimester   3. Intrauterine pregnancy   4. Blood type, Rh positive     Allergies as of 09/21/2019      Reactions   Caffeine Palpitations      Medication List    TAKE these medications   prenatal  multivitamin Tabs tablet Take 1 tablet by mouth daily at 12 noon.      -discussed s/sx of miscarriage, discussed this is most likely outcome of current pregnancy -discussed warning signs of miscarriage requiring return to MAU -repeat US scheduled with [facility] in one week - message sent to Candie Chroman -return MAU precautions given -pt discharged to home in stable condition  Kenly Xiao, Odie Sera, NP  9:48 PM 09/21/2019

## 2019-09-21 NOTE — MAU Note (Signed)
Had some pink spotting today one time. Mild cramping rates as a 1. Had u/s on Tues and saw IUP. Plans to repeat u/s wk from tomorrow so they can see everything. Had SAB 26mos ago

## 2019-09-21 NOTE — Discharge Instructions (Signed)
Aborto espontneo Miscarriage El aborto espontneo es la prdida de un beb que no ha nacido (feto) antes de la semana20 del embarazo. La mayor parte de los abortos espontneos ocurre durante los primeros 3meses de embarazo. A veces, un aborto ocurre antes de que la mujer sepa que est embarazada. El aborto espontneo puede ser una experiencia que afecte emocionalmente a la persona. Si ha sufrido un aborto espontneo, hable con su mdico y hgale las preguntas que tenga sobre el aborto espontneo, el proceso de duelo y los planes futuros de embarazo. Cules son las causas? Entre las causas de un aborto espontneo se incluyen las siguientes:  Problemas genticos o cromosmicos del feto. Estos problemas impiden que el beb se desarrolle con normalidad. En general, son el resultado de errores fortuitos que ocurren en la etapa temprana del desarrollo y que no se transmiten de padres a hijos (no se heredan).  Infeccin en el cuello del tero.  Trastornos que afectan el equilibrio hormonal del organismo.  Problemas en el cuello del tero, como su adelgazamiento y apertura antes de que el embarazo llegue a trmino (insuficiencia del cuello de tero).  Problemas en el tero. Estos pueden incluir, entre otros, los siguientes: ? Forma anormal del tero. ? Fibromas en el tero. ? Anormalidades congnitas. Estos son problemas que ya estaban presentes en el nacimiento.  Ciertas enfermedades crnicas.  Fumar, beber alcohol o usar drogas.  Lesiones (traumatismos). En muchos de los casos, se desconoce la causa de los abortos espontneos. Cules son los signos o los sntomas? Los sntomas de esta afeccin incluyen los siguientes:  Sangrado o manchado vaginal, con o sin clicos o dolor.  Dolor o clicos en el abdomen o en la parte inferior de la espalda.  Eliminacin de lquido, tejidos o cogulos sanguneos por la vagina. Cmo se diagnostica? Esta afeccin se puede diagnosticar en funcin de  lo siguiente:  Examen fsico.  Ecografa.  Anlisis de sangre.  Anlisis de orina. Cmo se trata? En algunos casos, el tratamiento de un aborto espontneo no es necesario si se eliminan de forma natural todos los tejidos que se encontraban en el tero. Si fuera necesario realizar un tratamiento por esta afeccin, este puede incluir lo siguiente:  Dilatacin y curetaje (D&C). Mediante este procedimiento, se expande el cuello del tero y se raspan las paredes (endometrio). Esto se realiza solamente si queda tejido del feto o la placenta dentro del cuerpo (aborto espontneo incompleto).  Medicamentos, por ejemplo: ? Antibiticos para tratar una infeccin. ? Medicamentos para ayudar al cuerpo a eliminar los restos de tejido. ? Medicamentos para reducir (contraer) el tamao del tero. Estos medicamentos se pueden administrar si tiene un sangrado abundante. Si su factor sanguneo es Rhnegativo y el de su beb es Rhpositivo, usted necesitar una inyeccin del medicamento llamado inmunoglobulinaRhpara proteger a los bebs futuros de tener problemas con el factorsanguneoRh. Los trminos "Rhnegativo" y "Rhpositivo" hacen referencia a la presencia o no en la sangre de una protena especfica que se encuentra en la superficie de los glbulos rojos (factor Rh). Siga estas indicaciones en su casa: Medicamentos   Tome los medicamentos de venta libre y los recetados solamente como se lo haya indicado el mdico.  Si le recetaron antibiticos, tmelos como se lo haya indicado el mdico. No deje de tomar los antibiticos aunque comience a sentirse mejor.  No tome antiinflamatorios no esteroideos (AINE), tales como aspirina e ibuprofeno, a menos que se lo indique el mdico. Estos medicamentos pueden provocarle sangrado. Actividad  Haga   reposo segn lo indicado. Pregntele al mdico qu actividades son seguras para usted.  Pdale a alguien que la ayude con las responsabilidades familiares y del  hogar durante este tiempo. Instrucciones generales  Lleve un registro de la cantidad y la saturacin de las toallas higinicas que Landscape architect. Anote esta informacin.  Anote la cantidad de tejido o cogulos sanguneos que expulsa por la vagina. Guarde las cantidades grandes de tejidos para que el [employer] examine.  No use tampones, no se haga duchas vaginales ni tenga relaciones sexuales hasta que el mdico la autorice.  Para que usted y su pareja puedan sobrellevar el proceso del duelo, hable con su mdico o busque apoyo psicolgico.  Cuando est lista, visite a su mdico para hablar sobre los pasos importantes que deber seguir en relacin con su salud. Tambin hable [employer] que deber tomar para tener un embarazo saludable en el futuro.  Concurra a todas las visitas de [address] se lo haya indicado el mdico. Esto es importante. Dnde encontrar ms informacin  Colegio Estadounidense de [place] y Insurance claims handler of Obstetricians and Gynecologists): www.acog.org  Departamento de Salud y [address] de los [address], [place] de Salud de Architectural technologist (U.S. Department of Health and [employer], Office on [employer]): [email or web address]/ [employer] con un mdico si:  Tiene fiebre o siente escalofros.  Tiene una secrecin vaginal con mal olor.  El sangrado aumenta en vez de disminuir. Solicite ayuda de inmediato si:  Siente calambres intensos o dolor en la espalda o en el abdomen.  Elimina cogulos de sangre o tejido por la vagina del tamao de una nuez o ms grandes.  Necesita ms de una toalla higinica de tamao regular por hora.  Se siente mareada o dbil.  Se desmaya.  Siente una tristeza que la invade o Archivist. Resumen  La mayor parte de los abortos espontneos ocurre durante los primeros de Psychiatrist. En algunos casos, el aborto espontneo ocurre antes de que la mujer sepa que est  [place].  Siga las indicaciones del mdico para el cuidado Facilities manager. Concurra a todas las visitas de control.  Para que usted y su pareja puedan sobrellevar el proceso del duelo, hable con su mdico o busque apoyo psicolgico. Esta informacin no tiene Theme park manager el consejo del mdico. Asegrese de hacerle al mdico cualquier pregunta que tenga. Document Revised: 11/30/2016 Document Reviewed: 11/30/2016 Elsevier Patient Education  2020 Elsevier Inc.       Aborto espontneo incompleto Incomplete Miscarriage El aborto espontneo es la prdida de un beb que no ha nacido (feto) antes de la semana20 del Psychiatrist. En un aborto espontneo incompleto, partes del feto o la placenta (alumbramiento) permanecen en el cuerpo. La mayor parte de los abortos espontneos ocurre durante los primeros de Psychiatrist. En algunos casos, sucede antes de que la mujer sepa que est [place]. El aborto espontneo puede ser Neomia Dear experiencia que afecte emocionalmente a Dealer. Si ha sufrido un aborto espontneo, hable con su mdico para formularle las preguntas que [address] aborto, el proceso de duelo y los planes futuros de Psychiatrist. Cules son las causas? Esta afeccin puede ser causada por lo siguiente:  Problemas genticos o cromosmicos que impiden que el beb se desarrolle con normalidad. Estos problemas son, en general, el resultado de errores fortuitos que ocurren en la etapa temprana del desarrollo y que no se transmiten de padres a hijos (no se heredan).  Infeccin en el  cuello del tero.  Trastornos que afectan el equilibrio hormonal del organismo.  Problemas en el cuello del tero, como su adelgazamiento y apertura antes de que el embarazo llegue a trmino (insuficiencia del cuello de tero).  Problemas en el tero, como un tero con forma anmala o fibromas en el tero, o problemas existentes desde el nacimiento (anormalidades congnitas).  Ciertas enfermedades  crnicas.  Fumar, beber alcohol o usar drogas.  Lesiones (traumatismos). Muchas veces la causa del aborto espontneo no se conoce. Cules son los signos o los sntomas? Los sntomas de esta afeccin incluyen los siguientes:  Sangrado o manchado vaginal, con o sin clicos o dolor.  Dolor o clicos en el abdomen o en la parte inferior de la espalda.  Eliminacin de lquido, tejidos o cogulos sanguneos por la vagina. Cmo se diagnostica? Esta afeccin se puede diagnosticar en funcin de lo siguiente:  Un examen fsico.  Ecografa.  Anlisis de [place].  Anlisis de [place]. Cmo se trata? Un aborto espontneo incompleto se puede tratar con alguno de los siguientes mtodos:  Dilatacin y curetaje (D&C). Mediante este procedimiento, se expande el cuello del tero y se raspan las paredes (endometrio) para eliminar todo resto de tejido del Psychiatrist.  Medicamentos, por ejemplo: ? Antibiticos para tratar una infeccin. ? Medicamentos para expulsar los restos de tejido del tero. ? Medicamentos para reducir Theatre manager) el tamao del tero. Estos medicamentos se pueden administrar si tiene un sangrado abundante. Si su factor sanguneo es Rhnegativo y [place] de su beb es Rhpositivo, usted Equities trader inyeccin del medicamento llamado inmunoglobulinaRhpara proteger a los bebs futuros de Warehouse manager problemas con el factorsanguneoRh. Los trminos "Rhnegativo" y "Rhpositivo" Information systems manager a la presencia o no en la sangre de una protena especfica que se encuentra en la superficie de los glbulos rojos (factorRh). Siga estas indicaciones en su casa: Medicamentos   [employer] de venta libre y los recetados solamente como se lo haya indicado el mdico.  Si le recetaron antibiticos, tmelos como se lo haya indicado el mdico. No deje de tomar el antibitico aunque comience a sentirse mejor.  No tome antiinflamatorios no esteroideos (AINE), tales como aspirina e  ibuprofeno, a menos que se lo indique el mdico. Estos medicamentos pueden provocarle sangrado. Actividad  Haga reposo segn lo indicado. Pregntele al mdico qu actividades son seguras para usted.  Pdale a alguien que la ayude con las responsabilidades familiares y del hogar durante este tiempo. Instrucciones generales  Lleve un registro de la cantidad y la saturacin de las toallas higinicas que Landscape architect. Anote esta informacin.  Anote la cantidad de tejido o cogulos sanguneos que expulsa por la vagina. Guarde las cantidades grandes de tejidos para que el [employer] examine.  No use tampones, no se haga duchas vaginales ni tenga relaciones sexuales hasta que el mdico la autorice.  Para que usted y su pareja puedan sobrellevar el proceso de duelo, hable con su mdico o busque apoyo psicolgico que los ayude a Runner, broadcasting/film/video la prdida del Psychiatrist.  Cuando est lista, visite a su mdico para hablar sobre los [employer] que deber seguir en relacin con su salud y para tener un embarazo saludable en el futuro.  Concurra a todas las visitas de [address] se lo haya indicado el mdico. Esto es importante. Dnde encontrar ms informacin  Colegio Estadounidense de [place] y Insurance claims handler of Obstetricians and Gynecologists): www.acog.org  Departamento de Salud y [address] de los [address], [place] de Salud de Architectural technologist (  U.S. Department of Health and Health and safety inspectorHuman Services, Office on Women's Health): [email or web address] Comunquese con un mdico si:  Tiene fiebre o siente escalofros.  Tiene una secrecin vaginal con mal olor. Solicite ayuda de inmediato si:  Siente calambres intensos o dolor en la espalda o en el abdomen.  Elimina cogulos sanguneos del tamao de una nuez (o ms grandes) o tejido por la vagina.  Tiene sangrado vaginal abundante y necesita ms de una toalla higinica de tamao regular por hora.  Se siente mareada o  dbil.  Se desmaya.  Siente una tristeza que la invade o Archivistpiensa en lastimarse. Resumen  En un aborto espontneo incompleto, partes del feto o la placenta (alumbramiento) permanecen en el cuerpo.  Existen diversas opciones de tratamiento para un aborto espontneo incompleto; hable con su mdico sobre la ms [place] May[place] Islandsadecuada para usted.  Siga las indicaciones del mdico en cuanto a los cuidados posteriores.  Para que usted y su pareja puedan sobrellevar el proceso de duelo, hable con su mdico o busque apoyo psicolgico que los ayude a Runner, broadcasting/film/videoenfrentar la prdida del Psychiatristembarazo. Esta informacin no tiene Theme park managercomo fin reemplazar el consejo del mdico. Asegrese de hacerle al mdico cualquier pregunta que tenga. Document Revised: 11/26/2016 Document Reviewed: 11/26/2016 Elsevier Patient Education  2020 [employer]vier Inc.       Devon EnergyComo sobrellevar la prdida de Chartered loss adjusterun embarazo Managing Pregnancy Loss La prdida del Psychiatristembarazo puede ocurrir en cualquier momento durante un Psychiatristembarazo. Generalmente no se conoce la causa. Rara vez se debe a algo que usted hizo. La prdida del Psychiatristembarazo a comienzos del Psychiatristembarazo (Physicist, medicaldurante el primer trimestre) se denomina aborto espontneo. Este es el tipo de prdida de un embarazo ms frecuente. La prdida del embarazo que ocurre despus de las 20 semanas de embarazo se denomina muerte fetal si el corazn del beb deja de latir antes del nacimiento. La muerte fetal es mucho menos frecuente. Algunas mujeres experimentan trabajo de parto espontneo poco despus de la muerte fetal, lo que ocasiona el nacimiento del beb muerto. Cualquier prdida de Chartered loss adjusterun embarazo puede ser devastadora. Tendr que recuperarse fsica y emocionalmente. La mayora de las mujeres son capaces de quedar embarazadas nuevamente despus de la prdida de un Psychiatristembarazo y dar a luz un beb sano. Cmo manejar la recuperacin emocional  La prdida de un embarazo es muy difcil emocionalmente. Es posible que sienta muchas emociones distintas  mientras hace el duelo. Puede sentirse triste y Broseleyenojada. Tambin puede sentir culpa. Es normal tener perodos de llanto. La recuperacin emocional puede llevar ms tiempo que la recuperacin fsica. Es diferente para Advertising account plannercada persona. Estas medidas pueden ayudarla a sobrellevar esta prdida:  Recuerde que es poco probable que haya hecho algo para causar la prdida del Psychiatristembarazo.  Comparta sus pensamientos y sentimientos con su pareja, familiares y Personnel officeramigos. Recuerde que su pareja tambin se est recuperando emocionalmente.  Asegrese de tener un buen sistema de apoyo. No pase demasiado tiempo sola.  Renase con un consejero sobre prdida de Elbertaembarazos o nase a un grupo de apoyo para prdida de Insurance underwriterembarazos.  Duerma lo suficiente y siga una dieta sana. Regrese a la actividad fsica con regularidad cuando se haya recuperado fsicamente.  No consuma drogas ni alcohol para controlar sus emociones.  Considere consultar a un profesional de salud mental para que la ayude a Insurance underwriterrecuperarse emocionalmente.  Pdale a un amigo o un ser querido que la ayude a decidir qu hacer con la ropa y los artculos infantiles que recibi para el beb. En el caso de Seldenmuerte  fetal, muchas mujeres se benefician al tomar [employer] adicionales en el proceso del duelo. Es recomendable que:  Sostenga al beb despus del nacimiento.  Le ponga un nombre al beb.  Solicite un certificado de nacimiento.  Cree un recuerdo, como las [address] de las manos o de los pies.  Vista al beb y pida que le tomen una fotografa.  Haga arreglos para Product manager.  Solicite un bautismo o una bendicin. Los hospitales tienen personal que puede ayudarla a Chief Strategy Officer todo esto. Cmo reconocer el estrs emocional Es normal tener estrs emocional despus de perder un Psychiatrist. Pero el estrs emocional que dura mucho tiempo o se vuelve grave requiere tratamiento. Tenga cuidado con estos signos de estrs emocional grave:  Tristeza, ira o culpa, que no  desaparecen y estn interfiriendo en sus actividades normales.  Problemas de relacin que hayan surgido o empeorado desde la prdida del Psychiatrist.  Signos de depresin que duran ms de 2 semanas. Pueden incluir: ? Tristeza. ? Ansiedad. ? Desesperanza. ? Falta de inters en las actividades que disfruta. ? Incapacidad para concentrarse. ? Dificultad para dormir o dormir demasiado. ? Prdida del apetito o comer en exceso. ? Pensamientos sobre la muerte o de [place] dao a s misma. Siga estas instrucciones en su casa:  Use los medicamentos de venta libre y los recetados solamente como se lo haya indicado el mdico.  Haga reposo en su casa hasta que su nivel de energa regrese. Reanude sus actividades normales segn lo indicado por el mdico. Pregntele al mdico qu actividades son seguras para usted.  Cuando est lista, visite a su mdico para hablar [employer] debe tomar para futuros embarazos.  Concurra a todas las visitas de [address] se lo haya indicado el mdico. Esto es importante. Dnde encontrar apoyo  Para que usted y su pareja puedan sobrellevar el proceso del duelo, hable con su mdico o busque apoyo psicolgico.  Considere la posibilidad de reunirse con otras mujeres que hayan sufrido la prdida de Chartered loss adjuster. Consulte al [employer] distintos grupos de apoyo y recursos. Dnde buscar ms informacin  U.S. Department of Health and [employer], Office on [address] Health (Departamento de Salud y [place] de los [place], [place] de Salud de la Mujer): [email or web address]/  American Pregnancy Association (Asociacin Americana del [place]): BroadwayMovies.se. Comunquese con un mdico si:  Contina sintiendo pena, tristeza o falta de motivacin para realizar las actividades diarias, y estos sentimientos no mejoran con Museum/gallery conservator.  Tiene problemas para recuperarse emocionalmente, en especial si est consumiendo alcohol o sustancias  para ayudar. Solicite ayuda inmediatamente si:  Piensa en lastimarse a usted misma o a Economist. Si alguna vez siente que puede lastimarse a usted misma o Physicist, medical a Economist, o tiene pensamientos de poner fin a su vida, busque ayuda de inmediato. Puede dirigirse al servicio de emergencias ms cercano o comunicarse con:  El servicio de emergencias de su localidad (911 en [place]  Una lnea de asistencia al suicida y Visual merchandiser en crisis, como National Suicide Prevention Lifeline (Lnea Nacional de Prevencin del Suicidio), al (609)402-8189. Est disponible las 24 horas del da. Resumen  Cualquier prdida de Chartered loss adjuster puede ser difcil fsica y emocionalmente.  Es posible que tenga muchas emociones distintas mientras hace el duelo. La recuperacin emocional puede durar ms tiempo que la recuperacin fsica.  Es normal tener estrs emocional despus de perder Chartered loss adjuster. Pero el estrs emocional que dura mucho tiempo o se vuelve grave requiere tratamiento.  Consulte a su mdico si tiene problemas emocionales despus de la prdida de Chartered loss adjuster. Esta informacin no tiene Theme park manager el consejo del mdico. Asegrese de hacerle al mdico cualquier pregunta que tenga. Document Revised: 06/20/2018 Document Reviewed: 06/05/2017 Elsevier Patient Education  2020 [employer].

## 2019-09-22 ENCOUNTER — Telehealth: Payer: Self-pay | Admitting: Obstetrics & Gynecology

## 2019-09-22 NOTE — Telephone Encounter (Signed)
Spoke with sister. Advised that per note from [facility] patient was advised to keep upcoming ultrasound appt. Advised her to call us if anything changes over the weekend. Also to go to [facility] if she develops any fever or severe pain. Patient agreeable. Has no other questions at this time.

## 2019-09-22 NOTE — Telephone Encounter (Signed)
[facility] and they told her the baby had passed / she is bleeding/ baby was measuring 6 weeks/ They need to know what she needs to do . They told her to call and f/u with us/ can someone review and call her sister n Scientist, research (life sciences) . She will be with her when you call.

## 2019-09-23 ENCOUNTER — Other Ambulatory Visit: Payer: Self-pay

## 2019-09-23 ENCOUNTER — Inpatient Hospital Stay (HOSPITAL_COMMUNITY)
Admission: AD | Admit: 2019-09-23 | Discharge: 2019-09-23 | Disposition: A | Discharge status: Home / Self Care | Payer: Medicaid Other | Attending: Obstetrics & Gynecology | Admitting: Obstetrics & Gynecology

## 2019-09-23 ENCOUNTER — Inpatient Hospital Stay (HOSPITAL_COMMUNITY): Payer: Medicaid Other

## 2019-09-23 ENCOUNTER — Encounter (HOSPITAL_COMMUNITY): Payer: Self-pay | Admitting: Obstetrics & Gynecology

## 2019-09-23 DIAGNOSIS — O034 Incomplete spontaneous abortion without complication: Secondary | ICD-10-CM

## 2019-09-23 DIAGNOSIS — O209 Hemorrhage in early pregnancy, unspecified: Secondary | ICD-10-CM | POA: Diagnosis present

## 2019-09-23 DIAGNOSIS — O039 Complete or unspecified spontaneous abortion without complication: Secondary | ICD-10-CM | POA: Insufficient documentation

## 2019-09-23 LAB — CBC
HCT: 40.4 % (ref 36.0–46.0)
Hemoglobin: 13.6 g/dL (ref 12.0–15.0)
MCH: 31 pg (ref 26.0–34.0)
MCHC: 33.7 g/dL (ref 30.0–36.0)
MCV: 92 fL (ref 80.0–100.0)
Platelets: 308 10*3/uL (ref 150–400)
RBC: 4.39 MIL/uL (ref 3.87–5.11)
RDW: 12.4 % (ref 11.5–15.5)
WBC: 10.9 10*3/uL — ABNORMAL HIGH (ref 4.0–10.5)
nRBC: 0 % (ref 0.0–0.2)

## 2019-09-23 LAB — HCG, QUANTITATIVE, PREGNANCY: hCG, Beta Chain, Quant, S: 1448 m[IU]/mL — ABNORMAL HIGH (ref <5)

## 2019-09-23 MED ORDER — MISOPROSTOL 200 MCG PO TABS
800.0000 ug | ORAL_TABLET | Freq: Once | ORAL | Status: AC
  Administered 2019-09-23: 800 ug via VAGINAL
  Filled 2019-09-23: qty 4

## 2019-09-23 MED ORDER — ONDANSETRON 4 MG PO TBDP
8.0000 mg | ORAL_TABLET | Freq: Once | ORAL | Status: AC
  Administered 2019-09-23: 8 mg via ORAL
  Filled 2019-09-23: qty 2

## 2019-09-23 MED ORDER — IBUPROFEN 800 MG PO TABS
800.0000 mg | ORAL_TABLET | Freq: Three times a day (TID) | ORAL | 0 refills | Status: DC | PRN
Start: 2019-09-23 — End: 2021-01-15

## 2019-09-23 MED ORDER — HYDROMORPHONE HCL 1 MG/ML IJ SOLN
1.0000 mg | Freq: Once | INTRAMUSCULAR | Status: AC
  Administered 2019-09-23: 1 mg via INTRAMUSCULAR
  Filled 2019-09-23: qty 1

## 2019-09-23 NOTE — Discharge Instructions (Signed)
Aborto espontneo Miscarriage El aborto espontneo es la prdida de un beb que no ha nacido (feto) antes de la semana20 del embarazo. La mayor parte de los abortos espontneos ocurre durante los primeros 3meses de embarazo. A veces, un aborto ocurre antes de que la mujer sepa que est embarazada. El aborto espontneo puede ser una experiencia que afecte emocionalmente a la persona. Si ha sufrido un aborto espontneo, hable con su mdico y hgale las preguntas que tenga sobre el aborto espontneo, el proceso de duelo y los planes futuros de embarazo. Cules son las causas? Entre las causas de un aborto espontneo se incluyen las siguientes:  Problemas genticos o cromosmicos del feto. Estos problemas impiden que el beb se desarrolle con normalidad. En general, son el resultado de errores fortuitos que ocurren en la etapa temprana del desarrollo y que no se transmiten de padres a hijos (no se heredan).  Infeccin en el cuello del tero.  Trastornos que afectan el equilibrio hormonal del organismo.  Problemas en el cuello del tero, como su adelgazamiento y apertura antes de que el embarazo llegue a trmino (insuficiencia del cuello de tero).  Problemas en el tero. Estos pueden incluir, entre otros, los siguientes: ? Forma anormal del tero. ? Fibromas en el tero. ? Anormalidades congnitas. Estos son problemas que ya estaban presentes en el nacimiento.  Ciertas enfermedades crnicas.  Fumar, beber alcohol o usar drogas.  Lesiones (traumatismos). En muchos de los casos, se desconoce la causa de los abortos espontneos. Cules son los signos o los sntomas? Los sntomas de esta afeccin incluyen los siguientes:  Sangrado o manchado vaginal, con o sin clicos o dolor.  Dolor o clicos en el abdomen o en la parte inferior de la espalda.  Eliminacin de lquido, tejidos o cogulos sanguneos por la vagina. Cmo se diagnostica? Esta afeccin se puede diagnosticar en funcin de  lo siguiente:  Examen fsico.  Ecografa.  Anlisis de sangre.  Anlisis de orina. Cmo se trata? En algunos casos, el tratamiento de un aborto espontneo no es necesario si se eliminan de forma natural todos los tejidos que se encontraban en el tero. Si fuera necesario realizar un tratamiento por esta afeccin, este puede incluir lo siguiente:  Dilatacin y curetaje (D&C). Mediante este procedimiento, se expande el cuello del tero y se raspan las paredes (endometrio). Esto se realiza solamente si queda tejido del feto o la placenta dentro del cuerpo (aborto espontneo incompleto).  Medicamentos, por ejemplo: ? Antibiticos para tratar una infeccin. ? Medicamentos para ayudar al cuerpo a eliminar los restos de tejido. ? Medicamentos para reducir (contraer) el tamao del tero. Estos medicamentos se pueden administrar si tiene un sangrado abundante. Si su factor sanguneo es Rhnegativo y el de su beb es Rhpositivo, usted necesitar una inyeccin del medicamento llamado inmunoglobulinaRhpara proteger a los bebs futuros de tener problemas con el factorsanguneoRh. Los trminos "Rhnegativo" y "Rhpositivo" hacen referencia a la presencia o no en la sangre de una protena especfica que se encuentra en la superficie de los glbulos rojos (factor Rh). Siga estas indicaciones en su casa: Medicamentos   Tome los medicamentos de venta libre y los recetados solamente como se lo haya indicado el mdico.  Si le recetaron antibiticos, tmelos como se lo haya indicado el mdico. No deje de tomar los antibiticos aunque comience a sentirse mejor.  No tome antiinflamatorios no esteroideos (AINE), tales como aspirina e ibuprofeno, a menos que se lo indique el mdico. Estos medicamentos pueden provocarle sangrado. Actividad  Haga   reposo segn lo indicado. Pregntele al mdico qu actividades son seguras para usted.  Pdale a alguien que la ayude con las responsabilidades familiares y del  hogar durante este tiempo. Instrucciones generales  Lleve un registro de la cantidad y la saturacin de las toallas higinicas que utiliza cada da. Anote esta informacin.  Anote la cantidad de tejido o cogulos sanguneos que expulsa por la vagina. Guarde las cantidades grandes de tejidos para que el mdico los examine.  No use tampones, no se haga duchas vaginales ni tenga relaciones sexuales hasta que el mdico la autorice.  Para que usted y su pareja puedan sobrellevar el proceso del duelo, hable con su mdico o busque apoyo psicolgico.  Cuando est lista, visite a su mdico para hablar sobre los pasos importantes que deber seguir en relacin con su salud. Tambin hable sobre las medidas que deber tomar para tener un embarazo saludable en el futuro.  Concurra a todas las visitas de seguimiento como se lo haya indicado el mdico. Esto es importante. Dnde encontrar ms informacin  Colegio Estadoun[place]ense de Obstetras y Gineclogos (American College of Obstetricians and Gynecologists): www.acog.org  Departamento de Salud y Servicios Humanos de los [place], Oficina de Salud de la Mujer (U.S. Department of Health and Human Services, Office on Women's Health): www.womenshealth.gov Comunquese con un mdico si:  Tiene fiebre o siente escalofros.  Tiene una secrecin vaginal con mal olor.  El sangrado aumenta en vez de disminuir. Solicite ayuda de inmediato si:  Siente calambres intensos o dolor en la espalda o en el abdomen.  Elimina cogulos de sangre o tejido por la vagina del tamao de una nuez o ms grandes.  Necesita ms de una toalla higinica de tamao regular por hora.  Se siente mareada o dbil.  Se desmaya.  Siente una tristeza que la invade o piensa en lastimarse. Resumen  La mayor parte de los abortos espontneos ocurre durante los primeros 3meses de embarazo. En algunos casos, el aborto espontneo ocurre antes de que la mujer sepa que est  embarazada.  Siga las indicaciones del mdico para el cuidado en el hogar. Concurra a todas las visitas de control.  Para que usted y su pareja puedan sobrellevar el proceso del duelo, hable con su mdico o busque apoyo psicolgico. Esta informacin no tiene como fin reemplazar el consejo del mdico. Asegrese de hacerle al mdico cualquier pregunta que tenga. Document Revised: 11/30/2016 Document Reviewed: 11/30/2016 Elsevier Patient Education  2020 Elsevier Inc.  

## 2019-09-23 NOTE — MAU Provider Note (Addendum)
History     CSN: [id number]  Arrival date and time: 09/23/19 4098   First Provider Initiated Contact with Patient 09/23/19 1908      Chief Complaint  Patient presents with  . Abdominal Pain  . Vaginal Bleeding   HPI Daisy Ford is a 29 y.o. G3P1011 at [redacted]w[redacted]d who presents with abdominal pain and vaginal bleeding. She was seen on 7/15 and told this was likely a failed pregnancy. She states this afternoon she started having bleeding that was heavier than a period and passing clots. She is also having 10/10 abdominal pain that she has not tried anything for.   OB History    Gravida  3   Para  1   Term  1   Preterm      AB  1   Living  1     SAB      TAB      Ectopic      Multiple  0   Live Births  1           Past Medical History:  Diagnosis Date  . Medical history non-contributory     Past Surgical History:  Procedure Laterality Date  . APPENDECTOMY      History reviewed. No pertinent family history.  Social History   Tobacco Use  . Smoking status: Never Smoker  . Smokeless tobacco: Never Used  Vaping Use  . Vaping Use: Never used  Substance Use Topics  . Alcohol use: No  . Drug use: No    Allergies:  Allergies  Allergen Reactions  . Caffeine Palpitations    Medications Prior to Admission  Medication Sig Dispense Refill Last Dose  . Prenatal Vit-Fe Fumarate-FA (PRENATAL MULTIVITAMIN) TABS tablet Take 1 tablet by mouth daily at 12 noon.       Review of Systems  Constitutional: Negative.  Negative for fatigue and fever.  HENT: Negative.   Respiratory: Negative.  Negative for shortness of breath.   Cardiovascular: Negative.  Negative for chest pain.  Gastrointestinal: Positive for abdominal pain. Negative for constipation, diarrhea, nausea and vomiting.  Genitourinary: Positive for vaginal bleeding. Negative for dysuria and vaginal discharge.  Neurological: Negative.  Negative for dizziness and headaches.   Physical Exam    Blood pressure 116/61, pulse (!) 106, temperature 98.5 F (36.9 C), temperature source Oral, resp. rate 16, last menstrual period 07/16/2019, SpO2 100 %.  Physical Exam Vitals and nursing note reviewed. Exam conducted with a chaperone present.  Constitutional:      General: She is not in acute distress.    Appearance: She is well-developed.  HENT:     Head: Normocephalic and atraumatic.  Pulmonary:     Effort: Pulmonary effort is normal. No respiratory distress.  Abdominal:     General: Abdomen is flat. There is no distension.     Palpations: Abdomen is soft.     Tenderness: There is no abdominal tenderness.  Genitourinary:    Vagina: Bleeding present.     Cervix: No friability.     Comments: ~6 cm of tissue removed from vagina & small trailing tissue pulled easily from cervical os Neurological:     Mental Status: She is alert.     MAU Course  Procedures Results for orders placed or performed during the hospital encounter of 09/23/19 (from the past 24 hour(s))  CBC     Status: Abnormal   Collection Time: 09/23/19  7:22 PM  Result Value Ref Range   WBC 10.9 (  H) 4.0 - 10.5 K/uL   RBC 4.39 3.87 - 5.11 MIL/uL   Hemoglobin 13.6 12.0 - 15.0 g/dL   HCT 16.0 36 - 46 %   MCV 92.0 80.0 - 100.0 fL   MCH 31.0 26.0 - 34.0 pg   MCHC 33.7 30.0 - 36.0 g/dL   RDW 73.7 10.6 - 26.9 %   Platelets 308 150 - 400 K/uL   nRBC 0.0 0.0 - 0.2 %  hCG, quantitative, pregnancy     Status: Abnormal   Collection Time: 09/23/19  7:22 PM  Result Value Ref Range   hCG, Beta Chain, Quant, S 1,448 (H) <5 mIU/mL    US OB Transvaginal  Result Date: 09/23/2019 CLINICAL DATA:  Miscarriage, quantitative beta hCG 1448, previous 09/21/2019, 3122) EXAM: TRANSVAGINAL OB ULTRASOUND TECHNIQUE: Transvaginal ultrasound was performed for complete evaluation of the gestation as well as the maternal uterus, adnexal regions, and pelvic cul-de-sac. COMPARISON:  Ultrasound 09/21/2019 FINDINGS: Intrauterine gestational  sac: None Yolk sac:  Not Visualized. Embryo:  Not Visualized. Cardiac Activity: Not Visualized. Maternal uterus/adnexae: Absence of the previously seen gestational sac. The endometrium measures up to 18 mm in double stripe endometrial thickness with some demonstrable color Doppler vascularity. There was demonstrable expulsion of tissue from the vagina/cervix at the time of examination. Anteverted uterus. Small posterior hypoechoic focus along the uterine fundus measuring 1 x 0.6 x 1 cm in size. Could reflect a prominent vascular channel or tiny fibroid. Trace free fluid in the pelvis is nonspecific. Ovaries are unremarkable. Corpus luteum noted in the right ovary. IMPRESSION: Absence of the previously seen gestational sac. Findings meet definitive criteria for failed pregnancy. This follows SRU consensus guidelines: Diagnostic Criteria for Nonviable Pregnancy Early in the First Trimester. [facility] Mis J Med [phone]. Persistent endometrial thickening and vascularity. The sonographic findings are compatible with RPOC in the appropriate clinical setting. Electronically Signed   By: Kreg Shropshire M.D.   On: 09/23/2019 21:42     MDM UA CBC, HCG Dilaudid IM  Care turned over to E. Lawrence NP at [phone]. Rolm Bookbinder, CNM 09/23/19 7:50 PM   *Spanish interpreter at bedside for this encounter*  HCG dropped to 1448 - consistent with miscarriage. Reviewed results with patient.  Hemoglobin & vital signs stable Pain improved with dilaudid & patient agreeable to pelvic exam. ~6 cm of tissue removed from vagina & small trailing tissue pulled easily from cervical os. Bleeding has slowed down. Will get ultrasound to see if anything retained.   RH positive  Care turned over to [facility] CNM 09/23/2019 2100  Assessment and Plan  Reassessment (10:06 PM)  -[place] results return with retained product. -Provider to bedside, with interpreter, to discuss results and POC. -Reviewed options of cytotec or  scheduled DNC for removal of retained products. -Patient agreeable to cytotec dosing and requests for placement now. -Will send script for Ibuprofen 800mg  sent to pharmacy on file.  -Will give of cytotec per vaginal now. -Questions and concerns from patient and support person addressed.  -Cautioned that provider does not know when onset will occur regarding bleeding and cramping. -Bleeding precautions given. -Patient expresses concern of hemorrhage as she has experienced in the past.  -Assured that iron level is stable and that cytotec is appropriate option, but cautioned that hemorrhage could occur and bleeding precautions reiterated.  -Patient and support without further questions or concerns. -Will send message to FT as patient has appt scheduled for July 23. -Encouraged to call or return to MAU  if symptoms worsen or with the onset of new symptoms. -Discharged to home in stable condition. -Nurse instructed on updated POC.  Cherre Robins MSN, CNM Advanced Practice Provider, [facility]

## 2019-09-23 NOTE — MAU Note (Signed)
Daisy Ford is a 29 y.o. at [redacted]w[redacted]d here in MAU reporting: bleeding got heavier and started passing large clots for the past 2 hours, has been bleeding since last MAU visit. Also having lower abdominal pain.  Onset of complaint: ongoing but worse  Pain score: 10/10  Vitals:   09/23/19 1844  BP: 116/61  Pulse: (!) 106  Resp: 16  Temp: 98.5 F (36.9 C)  SpO2: 100%     Lab orders placed from triage: none

## 2019-09-26 ENCOUNTER — Ambulatory Visit: Payer: Medicaid Other | Admitting: Nurse Practitioner

## 2019-09-28 ENCOUNTER — Other Ambulatory Visit: Payer: Self-pay | Admitting: Adult Health

## 2019-09-28 ENCOUNTER — Telehealth: Payer: Self-pay | Admitting: *Deleted

## 2019-09-28 ENCOUNTER — Other Ambulatory Visit: Payer: Self-pay | Admitting: Women's Health

## 2019-09-28 DIAGNOSIS — O3680X0 Pregnancy with inconclusive fetal viability, not applicable or unspecified: Secondary | ICD-10-CM

## 2019-09-28 NOTE — Telephone Encounter (Signed)
Sister called stating that patient received a notification that her appt for tomorrow was canceled. States she was told she needed to keep the appt when she went to the hospital.  Informed patient since she has miscarried, she does not need appt but will need f/u blood work.  States she would like to still be seen tomorrow as she is still having some pain and bleeding.  Informed this was expected with miscarriages but will get her back on the schedule for tomorrow to see a provider.  Pt appreciative.

## 2019-09-29 ENCOUNTER — Ambulatory Visit: Payer: Medicaid Other | Admitting: Women's Health

## 2019-09-29 ENCOUNTER — Ambulatory Visit (INDEPENDENT_AMBULATORY_CARE_PROVIDER_SITE_OTHER): Payer: Medicaid Other | Admitting: Women's Health

## 2019-09-29 ENCOUNTER — Encounter: Payer: Self-pay | Admitting: Women's Health

## 2019-09-29 ENCOUNTER — Other Ambulatory Visit: Payer: Medicaid Other

## 2019-09-29 VITALS — BP 112/72 | HR 80 | Wt 138.0 lb

## 2019-09-29 DIAGNOSIS — O034 Incomplete spontaneous abortion without complication: Secondary | ICD-10-CM

## 2019-09-29 DIAGNOSIS — O039 Complete or unspecified spontaneous abortion without complication: Secondary | ICD-10-CM

## 2019-09-29 MED ORDER — MISOPROSTOL 200 MCG PO TABS
800.0000 ug | ORAL_TABLET | Freq: Once | ORAL | 0 refills | Status: DC
Start: 2019-09-29 — End: 2020-05-07

## 2019-09-29 NOTE — Progress Notes (Signed)
GYN VISIT Patient name: Safaa Stingley MRN [id number]  Date of birth: 07-Aug-1990 Chief Complaint:   Follow-up (Miscarriage)  History of Present Illness:   Jilene Spohr is a 29 y.o. G24P1011 Hispanic female being seen today for f/u SAB. Had u/s 09/19/19 at [redacted]w[redacted]d by certain LMP, CRL c/w [redacted]w[redacted]d, GS c/w [redacted]w[redacted]d, HCG that day 4,279. Went to MAU 7/15 w/ bleeding, U/S revealed embryo no longer visualized, but GS w/ YS still present, HCG 3,122. Went back to MAU 7/17 w/ increased vaginal bleeding, ~6cm of tissue removed from vagina/os, U/S revealed no GS/YS, endometrium up to 78mm w/ some color doppler vascularity c/s retained POC, HCG 1,448,  she was given cytotec pv and d/c'd. States she really didn't bleed much after she left, 1 pill came out. Still having some cramping/pain, light bleeding, headaches.  May want to try to conceive again when this is done. Had a SAB ~84mth ago as well.  Depression screen [facility] 2/9 07/03/2019  Decreased Interest 0  Down, Depressed, Hopeless 0  PHQ - 2 Score 0  Altered sleeping 0  Tired, decreased energy 1  Change in appetite 0  Feeling bad or failure about yourself  0  Trouble concentrating 0  Moving slowly or fidgety/restless 0  Suicidal thoughts 0  PHQ-9 Score 1    Patient's last menstrual period was 07/16/2019 (exact date). The current method of family planning is none.  Last pap 07/03/19. Results were:  normal Review of Systems:   Pertinent items are noted in HPI Denies fever/chills, dizziness, headaches, visual disturbances, fatigue, shortness of breath, chest pain, abdominal pain, vomiting, abnormal vaginal discharge/itching/odor/irritation, problems with periods, bowel movements, urination, or intercourse unless otherwise stated above.  Pertinent History Reviewed:  Reviewed past medical,surgical, social, obstetrical and family history.  Reviewed problem list, medications and allergies. Physical Assessment:   Vitals:   09/29/19 1336    BP: 112/72  Pulse: 80  Weight: 138 lb (62.6 kg)  Body mass index is 22.96 kg/m.       Physical Examination:   General appearance: alert, well appearing, and in no distress  Mental status: alert, oriented to person, place, and time  Skin: warm & dry   Cardiovascular: normal heart rate noted  Respiratory: normal respiratory effort, no distress  Abdomen: soft, non-tender   Pelvic: VULVA: normal appearing vulva with no masses, tenderness or lesions, VAGINA: normal appearing vagina with normal color and discharge, no lesions, small amt light red nonodorous d/c CERVIX: normal appearing cervix without discharge or lesions, UTERUS: uterus is normal size, shape, consistency and nontender, ADNEXA: normal adnexa in size, nontender and no masses  Extremities: no edema   Informal TA u/s: endometrium still appears thickened  Chaperone: [place]    No results found for this or any previous visit (from the past 24 hour(s)).  Assessment & Plan:  1) SAB w/ retained POC> rx cytotecd pv x 1, check CBC/HCG today, reviewed warning s/s, reasons to seek care. F/U next week w/ MD in case fails cytotec again and needs D&C. Rh+  Meds:  Meds ordered this encounter  Medications  . misoprostol (CYTOTEC) 200 MCG tablet    Sig: Place 4 tablets (800 mcg total) vaginally once for 1 dose.    Dispense:  4 tablet    Refill:  0    Order Specific Question:   Supervising Provider    Answer:   Duane Lope H [[id number]]    Orders Placed This Encounter  Procedures  .  CBC  . Beta hCG quant (ref lab)    Return for f/u next week w/ MD.  Cheral Marker CNM, WHNP-BC 09/29/2019 1:48 PM

## 2019-09-30 LAB — BETA HCG QUANT (REF LAB): hCG Quant: 33 m[IU]/mL

## 2019-09-30 LAB — CBC
Hematocrit: 42.8 % (ref 34.0–46.6)
Hemoglobin: 14.2 g/dL (ref 11.1–15.9)
MCH: 31.3 pg (ref 26.6–33.0)
MCHC: 33.2 g/dL (ref 31.5–35.7)
MCV: 94 fL (ref 79–97)
Platelets: 368 10*3/uL (ref 150–450)
RBC: 4.54 x10E6/uL (ref 3.77–5.28)
RDW: 12.8 % (ref 11.7–15.4)
WBC: 5.7 10*3/uL (ref 3.4–10.8)

## 2019-10-09 ENCOUNTER — Encounter: Payer: Self-pay | Admitting: Obstetrics & Gynecology

## 2019-10-09 ENCOUNTER — Ambulatory Visit (INDEPENDENT_AMBULATORY_CARE_PROVIDER_SITE_OTHER): Payer: Medicaid Other | Admitting: Obstetrics & Gynecology

## 2019-10-09 VITALS — BP 118/65 | HR 93 | Ht 65.0 in | Wt 140.5 lb

## 2019-10-09 DIAGNOSIS — O039 Complete or unspecified spontaneous abortion without complication: Secondary | ICD-10-CM | POA: Diagnosis not present

## 2020-03-10 NOTE — Progress Notes (Signed)
  HPI: Patient returns for routine postoperative follow-up having undergone spontaneous miscarriage with Cytotec on given Cytotec on 09/29/2019.  The patient's immediate postoperative recovery has been unremarkable. Since hospital discharge the patient reports heavy bleeding cramping with passage of pregnancy.   Current Outpatient Medications: ibuprofen (ADVIL) 800 MG tablet, Take 1 tablet (800 mg total) by mouth every 8 (eight) hours as needed for cramping., Disp: 15 tablet, Rfl: 0 misoprostol (CYTOTEC) 200 MCG tablet, Place 4 tablets (800 mcg total) vaginally once for 1 dose., Disp: 4 tablet, Rfl: 0  No current facility-administered medications for this visit.    Blood pressure 118/65, pulse 93, height 5\' 5"  (1.651 m), weight 140 lb 8 oz (63.7 kg), last menstrual period 07/16/2019, unknown if currently breastfeeding.  Physical Exam:   Diagnostic Tests:   Pathology:   Impression: (O03.9) SAB (spontaneous abortion), complete  (primary encounter diagnosis)    Plan: No orders of the defined types were placed in this encounter.    Follow up: Return if symptoms worsen or fail to improve.   09/15/2019, MD

## 2020-05-07 ENCOUNTER — Other Ambulatory Visit (INDEPENDENT_AMBULATORY_CARE_PROVIDER_SITE_OTHER): Payer: Medicaid Other | Admitting: *Deleted

## 2020-05-07 ENCOUNTER — Other Ambulatory Visit: Payer: Self-pay

## 2020-05-07 DIAGNOSIS — R8290 Unspecified abnormal findings in urine: Secondary | ICD-10-CM

## 2020-05-07 DIAGNOSIS — R3915 Urgency of urination: Secondary | ICD-10-CM | POA: Diagnosis not present

## 2020-05-07 LAB — POCT URINALYSIS DIPSTICK
Glucose, UA: NEGATIVE
Ketones, UA: NEGATIVE
Leukocytes, UA: NEGATIVE
Nitrite, UA: NEGATIVE
Protein, UA: NEGATIVE

## 2020-05-07 NOTE — Progress Notes (Addendum)
   NURSE VISIT- UTI SYMPTOMS   SUBJECTIVE:  Daisy Ford is a 30 y.o. [phone] female here for UTI symptoms. She is a GYN patient. She reports urinary urgency and foul odor.  OBJECTIVE:  There were no vitals taken for this visit.  Appears well, in no apparent distress  No results found for this or any previous visit (from the past 24 hour(s)).  ASSESSMENT: GYN patient with UTI symptoms and negative nitrites  PLAN: Note routed to Joellyn Haff, CNM, [facility]   Rx sent by provider today: No, patient wants to wait for culture to return Urine culture sent Call or return to clinic prn if these symptoms worsen or fail to improve as anticipated. Follow-up: as needed   Jobe Marker  05/07/2020 3:40 PM   Chart reviewed for nurse visit. Agree with plan of care.  Cheral Marker, [place] 05/07/2020 4:50 PM

## 2020-05-12 LAB — URINE CULTURE

## 2020-05-13 ENCOUNTER — Other Ambulatory Visit: Payer: Self-pay | Admitting: Women's Health

## 2020-05-13 MED ORDER — NITROFURANTOIN MONOHYD MACRO 100 MG PO CAPS: 100.0000 mg | ORAL_CAPSULE | Freq: Two times a day (BID) | ORAL | 0 refills | Status: DC

## 2020-08-06 ENCOUNTER — Encounter: Payer: Self-pay | Admitting: Adult Health

## 2020-08-06 ENCOUNTER — Other Ambulatory Visit (HOSPITAL_COMMUNITY)
Admission: RE | Admit: 2020-08-06 | Discharge: 2020-08-06 | Disposition: A | Discharge status: Home / Self Care | Payer: Medicaid Other | Source: Ambulatory Visit | Attending: Adult Health | Admitting: Adult Health

## 2020-08-06 ENCOUNTER — Other Ambulatory Visit: Payer: Self-pay

## 2020-08-06 ENCOUNTER — Ambulatory Visit (INDEPENDENT_AMBULATORY_CARE_PROVIDER_SITE_OTHER): Payer: Medicaid Other | Admitting: Adult Health

## 2020-08-06 VITALS — BP 114/65 | HR 87 | Ht 65.0 in | Wt 149.8 lb

## 2020-08-06 DIAGNOSIS — N921 Excessive and frequent menstruation with irregular cycle: Secondary | ICD-10-CM | POA: Diagnosis present

## 2020-08-06 DIAGNOSIS — N96 Recurrent pregnancy loss: Secondary | ICD-10-CM | POA: Insufficient documentation

## 2020-08-06 DIAGNOSIS — Z319 Encounter for procreative management, unspecified: Secondary | ICD-10-CM | POA: Diagnosis not present

## 2020-08-06 NOTE — Progress Notes (Signed)
  Subjective:     Patient ID: Daisy Ford, female   DOB: 1990/07/16, 30 y.o.   MRN: [id number]  HPI Daisy Ford is a 30 year old Hispanic female, married, [id number] in complaining of bleeding between periods, and before sex, occasional pain with sex. Has interpreter with her. Lab Results  Component Value Date   DIAGPAP  07/03/2019    - Negative for intraepithelial lesion or malignancy (NILM)   HPVHIGH Negative 07/03/2019    Review of Systems +bleeding between periods and before sex and occasional apin, with sex Cramps at times  Reviewed past medical,surgical, social and family history. Reviewed medications and allergies.     Objective:   Physical Exam BP 114/65 (BP Location: Right Arm, Patient Position: Sitting, Cuff Size: Normal)   Pulse 87   Ht 5\' 5"  (1.651 m)   Wt 149 lb 12.8 oz (67.9 kg)   LMP 07/16/2020 (Exact Date)   Breastfeeding No   BMI 24.93 kg/m     Skin warm and dry.Pelvic: external genitalia is normal in appearance no lesions, vagina: white,frothy discharge without odor,urethra has no lesions or masses noted, cervix:smooth and bulbous, uterus: normal size, shape and contour, non tender, no masses felt, adnexa: no masses or tenderness noted. Bladder is non tender and no masses felt.CV swab obtained.  Upstream - 08/06/20 1205      Pregnancy Intention Screening   Does the patient want to become pregnant in the next year? Yes    Does the patient's partner want to become pregnant in the next year? Yes    Would the patient like to discuss contraceptive options today? No      Contraception Wrap Up   Current Method Pregnant/Seeking Pregnancy    End Method Pregnant/Seeking Pregnancy    Contraception Counseling Provided No         Examination chaperoned by 08/08/20 RN  Assessment:      1. Irregular intermenstrual bleeding CV swab sent for GC/CHL,tich,BV and yeast  2. Patient desires pregnancy Take PNV Discussed timing of sex  3. History of multiple  miscarriages     Plan:      Follow up prn

## 2020-08-08 ENCOUNTER — Other Ambulatory Visit: Payer: Self-pay | Admitting: Adult Health

## 2020-08-08 LAB — CERVICOVAGINAL ANCILLARY ONLY
Bacterial Vaginitis (gardnerella): POSITIVE — AB
Candida Glabrata: NEGATIVE
Candida Vaginitis: NEGATIVE
Chlamydia: NEGATIVE
Comment: NEGATIVE
Comment: NEGATIVE
Comment: NEGATIVE
Comment: NEGATIVE
Comment: NEGATIVE
Comment: NORMAL
Neisseria Gonorrhea: NEGATIVE
Trichomonas: NEGATIVE

## 2020-08-08 MED ORDER — METRONIDAZOLE 500 MG PO TABS
500.0000 mg | ORAL_TABLET | Freq: Two times a day (BID) | ORAL | 0 refills | Status: DC
Start: 2020-08-08 — End: 2020-11-20

## 2020-08-08 NOTE — Progress Notes (Signed)
+  BV on vaginal swab will rx flagyl 

## 2020-11-18 ENCOUNTER — Ambulatory Visit (INDEPENDENT_AMBULATORY_CARE_PROVIDER_SITE_OTHER): Payer: Medicaid Other

## 2020-11-18 ENCOUNTER — Other Ambulatory Visit (HOSPITAL_COMMUNITY)
Admission: RE | Admit: 2020-11-18 | Discharge: 2020-11-18 | Disposition: A | Discharge status: Home / Self Care | Payer: Medicaid Other | Source: Ambulatory Visit | Attending: Obstetrics & Gynecology | Admitting: Obstetrics & Gynecology

## 2020-11-18 ENCOUNTER — Other Ambulatory Visit: Payer: Self-pay

## 2020-11-18 VITALS — BP 117/66 | HR 88 | Ht 65.0 in | Wt 149.0 lb

## 2020-11-18 DIAGNOSIS — R399 Unspecified symptoms and signs involving the genitourinary system: Secondary | ICD-10-CM

## 2020-11-18 DIAGNOSIS — N898 Other specified noninflammatory disorders of vagina: Secondary | ICD-10-CM | POA: Diagnosis present

## 2020-11-18 DIAGNOSIS — Z3201 Encounter for pregnancy test, result positive: Secondary | ICD-10-CM

## 2020-11-18 DIAGNOSIS — Z3200 Encounter for pregnancy test, result unknown: Secondary | ICD-10-CM

## 2020-11-18 LAB — POCT URINALYSIS DIPSTICK OB
Glucose, UA: NEGATIVE
Ketones, UA: NEGATIVE
Nitrite, UA: POSITIVE
POC,PROTEIN,UA: NEGATIVE

## 2020-11-18 LAB — POCT URINE PREGNANCY: Preg Test, Ur: POSITIVE — AB

## 2020-11-18 MED ORDER — DOXYLAMINE-PYRIDOXINE 10-10 MG PO TBEC: DELAYED_RELEASE_TABLET | ORAL | 6 refills | Status: DC

## 2020-11-18 MED ORDER — CEPHALEXIN 500 MG PO CAPS: 500.0000 mg | ORAL_CAPSULE | Freq: Four times a day (QID) | ORAL | 0 refills | Status: DC

## 2020-11-18 NOTE — Progress Notes (Addendum)
NURSE VISIT- PREGNANCY CONFIRMATION   SUBJECTIVE:  Daisy Ford is a 30 y.o. [phone] female at [redacted]w[redacted]d by certain LMP of Patient's last menstrual period was 10/17/2020 (exact date). Here for pregnancy confirmation.  Home pregnancy test: positive x 2   She reports  headaches, nausea, cramping .  She is taking prenatal vitamins.    OBJECTIVE:  BP 117/66 (BP Location: Right Arm, Patient Position: Sitting, Cuff Size: Normal)   Pulse 88   Ht 5\' 5"  (1.651 m)   Wt 149 lb (67.6 kg)   LMP 10/17/2020 (Exact Date)   BMI 24.79 kg/m   Appears well, in no apparent distress  Results for orders placed or performed in visit on 11/18/20 (from the past 24 hour(s))  POC Urinalysis Dipstick OB   Collection Time: 11/18/20  3:36 PM  Result Value Ref Range   Color, UA     Clarity, UA     Glucose, UA Negative Negative   Bilirubin, UA     Ketones, UA neg    Spec Grav, UA     Blood, UA small    pH, UA     POC,PROTEIN,UA Negative Negative, Trace, Small (1+), Moderate (2+), Large (3+), 4+   Urobilinogen, UA     Nitrite, UA pos    Leukocytes, UA Small (1+) (A) Negative   Appearance     Odor    POCT urine pregnancy   Collection Time: 11/18/20  3:36 PM  Result Value Ref Range   Preg Test, Ur Positive (A) Negative    ASSESSMENT: Positive pregnancy test, [redacted]w[redacted]d by LMP    PLAN: Schedule for dating ultrasound in 3-4 weeks Prenatal vitamins: continue   Nausea medicines: requested-note routed to [redacted]w[redacted]d to send prescription   OB packet given: Yes  Chart reviewed for nurse visit. Agree with plan of care. Rx diclegis Joellyn Haff 11/18/2020 5:02 PM      NURSE VISIT- UTI SYMPTOMS   SUBJECTIVE:  Daisy Ford is a 30 y.o. [phone] female here for UTI symptoms. She is [redacted]w[redacted]d pregnant. She reports  odor, cloudy urine .  OBJECTIVE:  BP 117/66 (BP Location: Right Arm, Patient Position: Sitting, Cuff Size: Normal)   Pulse 88   Ht 5\' 5"  (1.651 m)   Wt 149 lb (67.6 kg)    LMP 10/17/2020 (Exact Date)   BMI 24.79 kg/m   Appears well, in no apparent distress  Results for orders placed or performed in visit on 11/18/20 (from the past 24 hour(s))  POC Urinalysis Dipstick OB   Collection Time: 11/18/20  3:36 PM  Result Value Ref Range   Color, UA     Clarity, UA     Glucose, UA Negative Negative   Bilirubin, UA     Ketones, UA neg    Spec Grav, UA     Blood, UA small    pH, UA     POC,PROTEIN,UA Negative Negative, Trace, Small (1+), Moderate (2+), Large (3+), 4+   Urobilinogen, UA     Nitrite, UA pos    Leukocytes, UA Small (1+) (A) Negative   Appearance     Odor    POCT urine pregnancy   Collection Time: 11/18/20  3:36 PM  Result Value Ref Range   Preg Test, Ur Positive (A) Negative    ASSESSMENT: Pregnancy [redacted]w[redacted]d with UTI symptoms and positive nitrites  PLAN: Note routed to 01/18/21, CNM, [facility]   Rx sent by provider today: Yes Urine culture sent Call or  return to clinic prn if these symptoms worsen or fail to improve as anticipated. Follow-up: as needed   Chart reviewed for nurse visit. Agree with plan of care. Rx Keflex Paulina Fusi 11/18/2020 4:59 PM      NURSE VISIT- VAGINITIS/STD  SUBJECTIVE:  Daisy Ford is a 30 y.o. [phone] [redacted]w[redacted]d pregnantfemale here for a vaginal swab for vaginitis screening, STD screen.  She reports the following symptoms: discharge described as white and creamy and vulvar itching for 1 week. Denies abnormal vaginal bleeding, significant pelvic pain, fever, or UTI symptoms.  OBJECTIVE:  BP 117/66 (BP Location: Right Arm, Patient Position: Sitting, Cuff Size: Normal)   Pulse 88   Ht 5\' 5"  (1.651 m)   Wt 149 lb (67.6 kg)   LMP 10/17/2020 (Exact Date)   BMI 24.79 kg/m   Appears well, in no apparent distress  ASSESSMENT: Vaginal swab for  vaginitis & STD screening  PLAN: Self-collected vaginal probe for Gonorrhea, Chlamydia, Trichomonas, Bacterial Vaginosis, Yeast sent to  lab Treatment: to be determined once results are received Follow-up as needed if symptoms persist/worsen, or new symptoms develop  Jonanthan Bolender A Chanon Loney  11/18/2020 3:54 PM

## 2020-11-18 NOTE — Addendum Note (Signed)
Addended by: Cheral Marker on: 11/18/2020 05:04 PM   Modules accepted: Orders

## 2020-11-20 ENCOUNTER — Telehealth: Payer: Self-pay | Admitting: Adult Health

## 2020-11-20 ENCOUNTER — Other Ambulatory Visit: Payer: Self-pay | Admitting: Women's Health

## 2020-11-20 LAB — CERVICOVAGINAL ANCILLARY ONLY
Bacterial Vaginitis (gardnerella): POSITIVE — AB
Candida Glabrata: NEGATIVE
Candida Vaginitis: POSITIVE — AB
Chlamydia: NEGATIVE
Comment: NEGATIVE
Comment: NEGATIVE
Comment: NEGATIVE
Comment: NEGATIVE
Comment: NEGATIVE
Comment: NORMAL
Neisseria Gonorrhea: NEGATIVE
Trichomonas: NEGATIVE

## 2020-11-20 MED ORDER — METRONIDAZOLE 500 MG PO TABS: 500.0000 mg | ORAL_TABLET | Freq: Two times a day (BID) | ORAL | 0 refills | Status: DC

## 2020-11-20 NOTE — Telephone Encounter (Signed)
Patient called stating that she had some labs done recently and she would like to know the results of that test, patient speaks spanish.

## 2020-11-22 ENCOUNTER — Other Ambulatory Visit: Payer: Self-pay | Admitting: Women's Health

## 2020-11-22 LAB — URINE CULTURE

## 2020-11-22 LAB — SPECIMEN STATUS REPORT

## 2020-12-10 ENCOUNTER — Other Ambulatory Visit: Payer: Self-pay | Admitting: Women's Health

## 2020-12-10 DIAGNOSIS — O3680X0 Pregnancy with inconclusive fetal viability, not applicable or unspecified: Secondary | ICD-10-CM

## 2020-12-11 ENCOUNTER — Ambulatory Visit (INDEPENDENT_AMBULATORY_CARE_PROVIDER_SITE_OTHER): Payer: Medicaid Other

## 2020-12-11 ENCOUNTER — Other Ambulatory Visit: Payer: Self-pay

## 2020-12-11 DIAGNOSIS — O3680X0 Pregnancy with inconclusive fetal viability, not applicable or unspecified: Secondary | ICD-10-CM

## 2020-12-11 DIAGNOSIS — Z3A01 Less than 8 weeks gestation of pregnancy: Secondary | ICD-10-CM

## 2020-12-11 NOTE — Progress Notes (Signed)
[place] 7+6 wks,single IUP with YS,FHR 160 BPM,CRL 13.16 mm,normal ovaries,posterior mid intramural fibroid 1.5 x 1.5 x 1.1 cm

## 2021-01-14 ENCOUNTER — Other Ambulatory Visit: Payer: Self-pay | Admitting: Obstetrics & Gynecology

## 2021-01-14 DIAGNOSIS — Z3682 Encounter for antenatal screening for nuchal translucency: Secondary | ICD-10-CM

## 2021-01-14 DIAGNOSIS — Z3490 Encounter for supervision of normal pregnancy, unspecified, unspecified trimester: Secondary | ICD-10-CM | POA: Insufficient documentation

## 2021-01-15 ENCOUNTER — Ambulatory Visit (INDEPENDENT_AMBULATORY_CARE_PROVIDER_SITE_OTHER): Payer: Medicaid Other | Admitting: Women's Health

## 2021-01-15 ENCOUNTER — Encounter: Payer: Self-pay | Admitting: Women's Health

## 2021-01-15 ENCOUNTER — Other Ambulatory Visit (HOSPITAL_COMMUNITY)
Admission: RE | Admit: 2021-01-15 | Discharge: 2021-01-15 | Disposition: A | Discharge status: Home / Self Care | Payer: Medicaid Other | Source: Ambulatory Visit | Attending: Women's Health | Admitting: Women's Health

## 2021-01-15 ENCOUNTER — Ambulatory Visit: Payer: Medicaid Other | Admitting: *Deleted

## 2021-01-15 ENCOUNTER — Other Ambulatory Visit: Payer: Self-pay

## 2021-01-15 ENCOUNTER — Ambulatory Visit (INDEPENDENT_AMBULATORY_CARE_PROVIDER_SITE_OTHER): Payer: Medicaid Other

## 2021-01-15 VITALS — BP 109/66 | HR 81 | Wt 146.0 lb

## 2021-01-15 DIAGNOSIS — Z3480 Encounter for supervision of other normal pregnancy, unspecified trimester: Secondary | ICD-10-CM | POA: Diagnosis not present

## 2021-01-15 DIAGNOSIS — Z3682 Encounter for antenatal screening for nuchal translucency: Secondary | ICD-10-CM

## 2021-01-15 DIAGNOSIS — O26891 Other specified pregnancy related conditions, first trimester: Secondary | ICD-10-CM | POA: Diagnosis present

## 2021-01-15 DIAGNOSIS — Z3A12 12 weeks gestation of pregnancy: Secondary | ICD-10-CM | POA: Diagnosis not present

## 2021-01-15 DIAGNOSIS — N898 Other specified noninflammatory disorders of vagina: Secondary | ICD-10-CM | POA: Insufficient documentation

## 2021-01-15 DIAGNOSIS — Z3481 Encounter for supervision of other normal pregnancy, first trimester: Secondary | ICD-10-CM | POA: Insufficient documentation

## 2021-01-15 LAB — POCT URINALYSIS DIPSTICK OB
Blood, UA: NEGATIVE
Glucose, UA: NEGATIVE
Ketones, UA: NEGATIVE
Leukocytes, UA: NEGATIVE
Nitrite, UA: NEGATIVE
POC,PROTEIN,UA: NEGATIVE

## 2021-01-15 MED ORDER — BLOOD PRESSURE MONITOR MISC: 0 refills | Status: DC

## 2021-01-15 NOTE — Patient Instructions (Signed)
Esme, thank you for choosing our office today! We appreciate the opportunity to meet your healthcare needs. You may receive a short survey by mail, e-mail, or through MyChart. If you are happy with your care we would appreciate if you could take just a few minutes to complete the survey questions. We read all of your comments and take your feedback very seriously. Thank you again for choosing our office.  [facility] ([address] [place], [place] [address]) Entrance C, located off of [address] Free 24/7 valet parking   Nausea & Vomiting Have saltine crackers or pretzels by your bed and eat a few bites before you raise your head out of bed in the morning Eat small frequent meals throughout the day instead of large meals Drink plenty of fluids throughout the day to stay hydrated, just don't drink a lot of fluids with your meals.  This can make your stomach fill up faster making you feel sick Do not brush your teeth right after you eat Products with real ginger are good for nausea, like ginger ale and ginger hard candy Make sure it says made with real ginger! Sucking on sour candy like lemon heads is also good for nausea If your prenatal vitamins make you nauseated, take them at night so you will sleep through the nausea Sea Bands If you feel like you need medicine for the nausea & vomiting please let us know If you are unable to keep any fluids or food down please let us know   Constipation Drink plenty of fluid, preferably water, throughout the day Eat foods high in fiber such as fruits, vegetables, and grains Exercise, such as walking, is a good way to keep your bowels regular Drink warm fluids, especially warm prune juice, or decaf coffee Eat a 1/2 cup of real oatmeal (not instant), 1/2 cup applesauce, and 1/2-1 cup warm prune juice every day If needed, you may take Colace (docusate sodium) stool softener  once or twice a day to help keep the stool soft.  If you still are having problems with constipation, you may take Miralax once daily as needed to help keep your bowels regular.   Home Blood Pressure Monitoring for Patients   Your provider has recommended that you check your blood pressure (BP) at least once a week at home. If you do not have a blood pressure cuff at home, one will be provided for you. Contact your provider if you have not received your monitor within 1 week.   Helpful Tips for Accurate Home Blood Pressure Checks  Don't smoke, exercise, or drink caffeine 30 minutes before checking your BP Use the restroom before checking your BP (a full bladder can raise your pressure) Relax in a comfortable upright chair Feet on the ground Left arm resting comfortably on a flat surface at the level of your heart Legs uncrossed Back supported Sit quietly and don't talk Place the cuff on your bare arm Adjust snuggly, so that only two fingertips can fit between your skin and the top of the cuff Check 2 readings separated by at least one minute Keep a log of your BP readings For a visual, please reference this diagram: http://ccnc.care/bpdiagram  Provider Name: [facility]     Phone: [phone]  Zone 1: ALL CLEAR  Continue to monitor your symptoms:  BP reading is less than 140 (top number) or less than 90 (bottom   number)  No right upper stomach pain No headaches or seeing spots No feeling nauseated or throwing up No swelling in face and hands  Zone 2: CAUTION Call your doctor's office for any of the following:  BP reading is greater than 140 (top number) or greater than 90 (bottom number)  Stomach pain under your ribs in the middle or right side Headaches or seeing spots Feeling nauseated or throwing up Swelling in face and hands  Zone 3: EMERGENCY  Seek immediate medical care if you have any of the following:  BP reading is greater than160 (top number) or greater than  110 (bottom number) Severe headaches not improving with Tylenol Serious difficulty catching your breath Any worsening symptoms from Zone 2   Primer trimestre de embarazo First Trimester of Pregnancy El primer trimestre de embarazo comienza Film/video editor de su ltimo periodo menstrual hasta el final de la semana 12. Es Designer, jewellery desde el mes 1 hasta el mes 3 de [place]. Una semana despus de que un espermatozoide fecunda un vulo, este se implantar en la pared uterina y comenzar a desarrollarse hasta convertirse en un beb. Al final de las 12 [address], se formarn todos los rganos del beb y el beb tendr un tamao de [place] 2 y 3 pulgadas. Durante Financial risk analyst trimestre, ocurren cambios en el cuerpo Su organismo atraviesa por muchos cambios durante el [place]. Los cambios varan y generalmente vuelven a la normalidad despus del nacimiento del beb. Cambios fsicos Usted puede aumentar o bajar de [place]. Las [employer] pueden empezar a Government social research officer y Emergency planning/management officer. El tejido que rodea los pezones (arola) puede tornarse ms oscuro. Pueden aparecer zonas oscuras o manchas (cloasma o mscara del embarazo) en el rostro. Tal vez haya cambios en el cabello. Estos pueden incluir engrosamiento, afinamiento y [employer] textura. Cambios en la salud Quizs sienta nuseas y es posible que vomite. Es posible que tenga [place] estomacal. Comienza a tener dolores de [place]. Puede tener estreimiento. Las Veterinary surgeon y estar sensibles al cepillado y al hilo dental. Otros cambios Puede cansarse con facilidad. Puede orinar con mayor frecuencia. Los perodos menstruales se interrumpirn. Tal vez no tenga apetito. Puede sentir un fuerte deseo de consumir ciertos alimentos. Puede tener cambios en sus emociones de un da para Therapist, art. Tendr sueos ms vvidos y extraos. Siga estas instrucciones en su casa: Medicamentos Siga las instrucciones del mdico en relacin con el uso de medicamentos. Durante el  embarazo, hay medicamentos que pueden tomarse y otros que no. No use ningn medicamento a menos que se los haya indicado el mdico. Tome vitaminas prenatales que contengan por lo menos 600 microgramos (mcg) de cido flico. Comida y bebida Lleve una dieta saludable que incluya frutas y verduras frescas, cereales integrales, buenas fuentes de protenas como carnes [place], huevos o tofu, y productos lcteos descremados. Evite la carne cruda y el [place], la [place] y el queso sin Market researcher. Estos portan grmenes que pueden provocar dao tanto a usted como al beb. Siente nuseas o vomita: Ingiera 4 o 5 comidas pequeas por Geophysical data processor de 3 abundantes. Intente comer algunas galletitas saladas. Beba lquidos [employer], en lugar de Sports coach. Es posible que tenga que tomar estas medidas para prevenir o tratar el estreimiento: Product manager suficiente lquido como para Pharmacologist la orina de color amarillo plido. Consumir alimentos ricos en fibra, como frijoles, cereales integrales, y frutas y verduras frescas. Limitar el consumo de alimentos ricos en grasa y azcares procesados, como los  alimentos fritos o dulces. Actividad Haga ejercicio solamente como se lo haya indicado el mdico. La mayora de las personas pueden continuar su rutina de ejercicios durante el [place]. Intente realizar como mnimo 30 minutos de actividad fsica por lo menos 5 das a la [place]. Deje de hacer ejercicio si le aparecen dolor o clicos en la parte baja del vientre o de la espalda. Evite hacer ejercicio si hace mucho calor o humedad, o si se encuentra a una altitud elevada. Evite levantar pesos [employer]. Si lo desea, puede seguir teniendo [employer], salvo que el mdico le indique lo contrario. Alivio del dolor y del Dentist Use un sostn que le brinde buen soporte para Engineer, materials de [place]. Descanse con las piernas elevadas si tiene calambres o dolor de cintura. Si desarrolla venas abultadas  (vrices) en las piernas: Use medias de compresin como se lo haya indicado el mdico. Eleve los pies durante 15 minutos, 3 o 4 veces por da. Limite el consumo de sal en su dieta. Seguridad Use el cinturn de seguridad en todo momento mientras conduce o va en auto. Hable con el mdico si es vctima de [employer] o fsico. Hable con el mdico si se siente triste o tiene pensamientos acerca de [place] dao a usted misma. Estilo de vida No se d baos de inmersin en agua caliente, baos turcos ni saunas. No se haga duchas vaginales. No use tampones ni toallas higinicas perfumadas. No use remedios a base de hierbas, alcohol, drogas ilegales ni medicamentos que no estn aprobados por el mdico. Las sustancias qumicas de estos productos pueden daar al beb. No consuma ningn producto que contenga nicotina o tabaco, como cigarrillos, cigarrillos electrnicos y tabaco de Theatre manager. Si necesita ayuda para dejar de fumar, consulte al mdico. Evite el contacto con las bandejas sanitarias de los gatos y la tierra que estos animales usan. Estos elementos contienen bacterias que pueden causar defectos congnitos al beb y la posible prdida del beb en gestacin (feto) debido a un aborto espontneo o muerte fetal. Instrucciones generales Durante las visitas prenatales de rutina del Engineer, maintenance trimestre, el mdico le har un examen fsico, Education officer, environmental las pruebas necesarias y le preguntar cmo Merchant navy officer las cosas. Cumpla con todas las visitas de seguimiento. Esto es importante. Pida ayuda si tiene necesidades nutricionales o de asesoramiento Academic librarian. El mdico puede aconsejarla o derivarla a especialistas para que la ayuden con diferentes necesidades. Programe una cita con el dentista. En su casa, lvese los dientes con un cepillo dental suave. Psese el hilo dental suavemente. Escriba sus preguntas. Llvelas cuando concurra a las visitas prenatales. Dnde buscar ms informacin American Pregnancy  Association (Asociacin Americana del Embarazo): americanpregnancy.org [employer] of Obstetricians and Gynecologists (Colegio Estadounidense de Obstetras y [place]): EmploymentAssurance.cz? Office on Pitney [employer] (Oficina para la Salud de la Mujer): MightyReward.co.nz Comunquese con un mdico si tiene: Research scientist (life sciences). Grant Ruts. Clicos leves, presin en la pelvis o dolor persistente en el abdomen. Nuseas, vmitos o diarrea que duran 24 horas o ms. Una secrecin vaginal con mal olor. Dolor al Beatrix Shipper. Una enfermedad contagiosa, como varicela, sarampin, virus de [place], VIH o hepatitis. Solicite ayuda inmediatamente si: Tiene manchado o sangrado de la vagina. Tiene dolor intenso o clicos en el abdomen. Siente que le falta el aire o dolor en el pecho. Sufre cualquier tipo de traumatismo, por ejemplo, debido a una cada o un accidente automovilstico. Dolor, hinchazn o enrojecimiento nuevos en un brazo o una pierna, o un aumento de alguno de estos sntomas.  Resumen Financial risk analyst trimestre del Community education officer de su ltimo periodo menstrual hasta el final de la semana 12 (meses 1 al 3). Hacer 4 o 5 comidas pequeas al Geophysical data processor de 3 comidas grandes tambin puede aliviar las nuseas y los vmitos. No consuma ningn producto que contenga nicotina o tabaco, como cigarrillos, cigarrillos electrnicos y tabaco de Theatre manager. Si necesita ayuda para dejar de fumar, consulte al mdico. Cumpla con todas las visitas de seguimiento. Esto es importante. Esta informacin no tiene Theme park manager el consejo del mdico. Asegrese de hacerle al mdico cualquier pregunta que tenga. Document Revised: 08/28/2019 Document Reviewed: 08/28/2019 Elsevier Patient Education  2022 [employer].

## 2021-01-15 NOTE — Progress Notes (Signed)
INITIAL OBSTETRICAL VISIT Patient name: Daisy Ford MRN [id number]  Date of birth: 1990/10/17 Chief Complaint:   Initial Prenatal Visit (Lower back pain on right side, vaginal discharge)  History of Present Illness:   Daisy Ford is a 30 y.o. [phone] Hispanic female at [redacted]w[redacted]d by LMP c/w u/s at 7 weeks with an Estimated Date of Delivery: 07/24/21 being seen today for her initial obstetrical visit.   Patient's last menstrual period was 10/17/2020 (exact date). Her obstetrical history is significant for  term uncomplicated SVB x 1, SAB x 2 .   Today she reports  vaginal d/c w/ itching, no odor . Traveling to [place] in Dec Last pap 07/03/19. Results were: NILM w/ HRHPV not done  Depression screen [facility] 2/9 01/15/2021 07/03/2019  Decreased Interest 0 0  Down, Depressed, Hopeless 0 0  PHQ - 2 Score 0 0  Altered sleeping 1 0  Tired, decreased energy 1 1  Change in appetite 0 0  Feeling bad or failure about yourself  0 0  Trouble concentrating 0 0  Moving slowly or fidgety/restless 0 0  Suicidal thoughts 0 0  PHQ-9 Score 2 1     GAD 7 : Generalized Anxiety Score 01/15/2021 07/03/2019  Nervous, Anxious, on Edge 0 0  Control/stop worrying 0 0  Worry too much - different things 0 0  Trouble relaxing 0 0  Restless 0 0  Easily annoyed or irritable 0 0  Afraid - awful might happen 0 0  Total GAD 7 Score 0 0     Review of Systems:   Pertinent items are noted in HPI Denies cramping/contractions, leakage of fluid, vaginal bleeding, abnormal vaginal discharge w/ itching/odor/irritation, headaches, visual changes, shortness of breath, chest pain, abdominal pain, severe nausea/vomiting, or problems with urination or bowel movements unless otherwise stated above.  Pertinent History Reviewed:  Reviewed past medical,surgical, social, obstetrical and family history.  Reviewed problem list, medications and allergies. OB History  Gravida Para Term Preterm AB Living  4 1 1   2 1    SAB IAB Ectopic Multiple Live Births  2     0 1    # Outcome Date GA Lbr Len/2nd Weight Sex Delivery Anes PTL Lv  4 Current           3 SAB 09/2019     SAB     2 Term 05/02/17 [redacted]w[redacted]d 10:08 / 00:43 7 lb 4.2 oz (3.295 kg) M Vag-Spont EPI N LIV  1 SAB            Physical Assessment:   Vitals:   01/15/21 1452  BP: 109/66  Pulse: 81  Weight: 146 lb (66.2 kg)  Body mass index is 24.3 kg/m.       Physical Examination:  General appearance - well appearing, and in no distress  Mental status - alert, oriented to person, place, and time  Psych:  She has a normal mood and affect  Skin - warm and dry, normal color, no suspicious lesions noted  Chest - effort normal, all lung fields clear to auscultation bilaterally  Heart - normal rate and regular rhythm  Abdomen - soft, nontender  Extremities:  No swelling or varicosities noted  Pelvic - VULVA: normal appearing vulva with no masses, tenderness or lesions  VAGINA: normal appearing vagina with normal color and discharge, no lesions  CERVIX: normal appearing cervix without discharge or lesions, no CMT  Thin prep pap is not done   Chaperone:  Slyvia,  interpreter     TODAY'S NT [place] 12+6 wks,measurements c/w dates,CRL 69.46 mm,NB present,NT 1.6 mm,normal ovaries,posterior placenta,FHR 169 bpm  Results for orders placed or performed in visit on 01/15/21 (from the past 24 hour(s))  POC Urinalysis Dipstick OB   Collection Time: 01/15/21  3:35 PM  Result Value Ref Range   Color, UA     Clarity, UA     Glucose, UA Negative Negative   Bilirubin, UA     Ketones, UA neg    Spec Grav, UA     Blood, UA neg    pH, UA     POC,PROTEIN,UA Negative Negative, Trace, Small (1+), Moderate (2+), Large (3+), 4+   Urobilinogen, UA     Nitrite, UA neg    Leukocytes, UA Negative Negative   Appearance     Odor      Assessment & Plan:  1) Low-Risk Pregnancy [id number] at [redacted]w[redacted]d with an Estimated Date of Delivery: 07/24/21   2) Initial OB visit  3)  Traveling to [place] in Dec> Discussed she is at a high risk for DVT/PE when pregnancy and traveling increases that risk. Recommended walking around q 1hr, don't cross legs, and dorsiflex feet often to help decrease r/f DVT/PE.   4) Vag d/c w/ itching> CV swab sent  Meds:  Meds ordered this encounter  Medications   Blood Pressure Monitor MISC    Sig: For regular home bp monitoring during pregnancy    Dispense:  1 each    Refill:  0    Z34.81 Please mail to patient    Initial labs obtained Continue prenatal vitamins Reviewed n/v relief measures and warning s/s to report Reviewed recommended weight gain based on pre-gravid BMI Encouraged well-balanced diet Genetic & carrier screening discussed: requests Panorama, NT/IT, and Horizon  Ultrasound discussed; fetal survey: requested CCNC completed> form faxed if has or is planning to apply for medicaid The nature of [employer] for [employer] with multiple MDs and other Advanced Practice Providers was explained to patient; also emphasized that fellows, residents, and students are part of our team. Does not have home bp cuff. Office bp cuff given: no. Rx sent: yes. Check bp weekly, let us know if consistently >140/90.   Follow-up: Return in about 4 weeks (around 02/12/2021) for LROB, 2nd IT, CNM, in person.   Orders Placed This Encounter  Procedures   Urine Culture   Integrated 1   Pain Management Screening Profile (10S)   Genetic Screening   CBC/D/Plt+RPR+Rh+ABO+RubIgG...   POC Urinalysis Dipstick OB    Cheral Marker CNM, [facility] 01/15/2021 3:56 PM

## 2021-01-15 NOTE — Progress Notes (Signed)
[place] 12+6 wks,measurements c/w dates,CRL 69.46 mm,NB present,NT 1.6 mm,normal ovaries,posterior placenta,FHR 169 bpm

## 2021-01-17 LAB — CERVICOVAGINAL ANCILLARY ONLY
Bacterial Vaginitis (gardnerella): POSITIVE — AB
Candida Glabrata: NEGATIVE
Candida Vaginitis: POSITIVE — AB
Chlamydia: NEGATIVE
Comment: NEGATIVE
Comment: NEGATIVE
Comment: NEGATIVE
Comment: NEGATIVE
Comment: NEGATIVE
Comment: NORMAL
Neisseria Gonorrhea: NEGATIVE
Trichomonas: NEGATIVE

## 2021-01-17 LAB — CBC/D/PLT+RPR+RH+ABO+RUBIGG...
Antibody Screen: NEGATIVE
Basophils Absolute: 0 10*3/uL (ref 0.0–0.2)
Basos: 0 %
EOS (ABSOLUTE): 0.1 10*3/uL (ref 0.0–0.4)
Eos: 1 %
HCV Ab: <0.1 s/co ratio (ref 0.0–0.9)
HIV Screen 4th Generation wRfx: NONREACTIVE
Hematocrit: 40.6 % (ref 34.0–46.6)
Hemoglobin: 13.9 g/dL (ref 11.1–15.9)
Hepatitis B Surface Ag: NEGATIVE
Immature Grans (Abs): 0 10*3/uL (ref 0.0–0.1)
Immature Granulocytes: 0 %
Lymphocytes Absolute: 1.9 10*3/uL (ref 0.7–3.1)
Lymphs: 22 %
MCH: 31.4 pg (ref 26.6–33.0)
MCHC: 34.2 g/dL (ref 31.5–35.7)
MCV: 92 fL (ref 79–97)
Monocytes Absolute: 0.4 10*3/uL (ref 0.1–0.9)
Monocytes: 5 %
Neutrophils Absolute: 6.1 10*3/uL (ref 1.4–7.0)
Neutrophils: 72 %
Platelets: 314 10*3/uL (ref 150–450)
RBC: 4.43 x10E6/uL (ref 3.77–5.28)
RDW: 12.8 % (ref 11.7–15.4)
RPR Ser Ql: NONREACTIVE
Rh Factor: POSITIVE
Rubella Antibodies, IGG: 10.7 index (ref >0.99)
WBC: 8.5 10*3/uL (ref 3.4–10.8)

## 2021-01-17 LAB — INTEGRATED 1
Crown Rump Length: 69.5 mm
Gest. Age on Collection Date: 13 weeks
Maternal Age at EDD: 29.5 yr
Nuchal Translucency (NT): 1.6 mm
Number of Fetuses: 1
PAPP-A Value: 597.6 ng/mL
Weight: 146 [lb_av]

## 2021-01-17 LAB — HCV INTERPRETATION

## 2021-01-20 LAB — URINE CULTURE

## 2021-01-20 MED ORDER — METRONIDAZOLE 500 MG PO TABS: 500.0000 mg | ORAL_TABLET | Freq: Two times a day (BID) | ORAL | 0 refills | Status: DC

## 2021-01-20 NOTE — Addendum Note (Signed)
Addended by: Cheral Marker on: 01/20/2021 09:51 AM   Modules accepted: Orders

## 2021-01-23 LAB — PMP SCREEN PROFILE (10S), URINE
Amphetamine Scrn, Ur: NEGATIVE ng/mL
BARBITURATE SCREEN URINE: NEGATIVE ng/mL
BENZODIAZEPINE SCREEN, URINE: NEGATIVE ng/mL
CANNABINOIDS UR QL SCN: NEGATIVE ng/mL
Cocaine (Metab) Scrn, Ur: NEGATIVE ng/mL
Creatinine(Crt), U: 19.8 mg/dL — ABNORMAL LOW (ref 20.0–300.0)
Methadone Screen, Urine: NEGATIVE ng/mL
OXYCODONE+OXYMORPHONE UR QL SCN: NEGATIVE ng/mL
Opiate Scrn, Ur: NEGATIVE ng/mL
Ph of Urine: 6.7 (ref 4.5–8.9)
Phencyclidine Qn, Ur: NEGATIVE ng/mL
Propoxyphene Scrn, Ur: NEGATIVE ng/mL

## 2021-01-23 LAB — SPECIFIC GRAVITY (REFLEXED): SPECIFIC GRAVITY: 1.0051

## 2021-01-24 ENCOUNTER — Encounter: Payer: Self-pay | Admitting: Women's Health

## 2021-01-28 ENCOUNTER — Encounter: Payer: Self-pay | Admitting: Women's Health

## 2021-01-29 ENCOUNTER — Encounter: Payer: Self-pay | Admitting: Women's Health

## 2021-02-12 ENCOUNTER — Ambulatory Visit (INDEPENDENT_AMBULATORY_CARE_PROVIDER_SITE_OTHER): Payer: Medicaid Other | Admitting: Advanced Practice Midwife

## 2021-02-12 ENCOUNTER — Other Ambulatory Visit: Payer: Self-pay

## 2021-02-12 VITALS — BP 112/65 | HR 98 | Wt 146.0 lb

## 2021-02-12 DIAGNOSIS — Z3A16 16 weeks gestation of pregnancy: Secondary | ICD-10-CM

## 2021-02-12 DIAGNOSIS — Z789 Other specified health status: Secondary | ICD-10-CM

## 2021-02-12 DIAGNOSIS — Z1379 Encounter for other screening for genetic and chromosomal anomalies: Secondary | ICD-10-CM

## 2021-02-12 DIAGNOSIS — Z363 Encounter for antenatal screening for malformations: Secondary | ICD-10-CM

## 2021-02-12 DIAGNOSIS — Z603 Acculturation difficulty: Secondary | ICD-10-CM

## 2021-02-12 DIAGNOSIS — Z3480 Encounter for supervision of other normal pregnancy, unspecified trimester: Secondary | ICD-10-CM

## 2021-02-12 NOTE — Progress Notes (Signed)
   LOW-RISK PREGNANCY VISIT Patient name: Daisy Ford MRN [id number]  Date of birth: 1990/05/02 Chief Complaint:   Routine Prenatal Visit  History of Present Illness:   Daisy Ford is a 30 y.o. [phone] female at [redacted]w[redacted]d with an Estimated Date of Delivery: 07/24/21 being seen today for ongoing management of a low-risk pregnancy.  Today she reports  her groin being sore at times; is going to [place] tomorrow until 03/18/21 . Contractions: Irritability. Vag. Bleeding: None.  Movement: Present. denies leaking of fluid. Review of Systems:   Pertinent items are noted in HPI Denies abnormal vaginal discharge w/ itching/odor/irritation, headaches, visual changes, shortness of breath, chest pain, abdominal pain, severe nausea/vomiting, or problems with urination or bowel movements unless otherwise stated above. Pertinent History Reviewed:  Reviewed past medical,surgical, social, obstetrical and family history.  Reviewed problem list, medications and allergies. Physical Assessment:   Vitals:   02/12/21 1129  BP: 112/65  Pulse: 98  Weight: 146 lb (66.2 kg)  Body mass index is 24.3 kg/m.        Physical Examination:   General appearance: Well appearing, and in no distress  Mental status: Alert, oriented to person, place, and time  Skin: Warm & dry  Cardiovascular: Normal heart rate noted  Respiratory: Normal respiratory effort, no distress  Abdomen: Soft, gravid, nontender  Pelvic: Cervical exam deferred         Extremities: Edema: None  Fetal Status: Fetal Heart Rate (bpm): 153   Movement: Present    No results found for this or any previous visit (from the past 24 hour(s)).  Assessment & Plan:  1) Low-risk pregnancy [id number] at [redacted]w[redacted]d with an Estimated Date of Delivery: 07/24/21   2) Flying to [place] 02/13/21-03/18/21, will take ASA 81mg  daily before/after flight, walk on plane, and do leg exercises; anatomy scan when gets back   Meds: No orders of the defined types were  placed in this encounter.  Labs/procedures today: 2nd IT  Plan:  Continue routine obstetrical care   Reviewed: Preterm labor symptoms and general obstetric precautions including but not limited to vaginal bleeding, contractions, leaking of fluid and fetal movement were reviewed in detail with the patient.  All questions were answered. Didn't ask about home bp cuff. Check bp weekly, let know if >140/90.   Follow-up: Return in about 4 weeks (around 03/12/2021) for LROB, 05/10/2021: Anatomy, in person.  Orders Placed This Encounter  Procedures   US OB Comp + 14 Wk   INTEGRATED 2   [place] [facility] 02/12/2021 11:52 AM

## 2021-02-19 LAB — INTEGRATED 2
AFP MoM: 0.74
Alpha-Fetoprotein: 28.3 ng/mL
Crown Rump Length: 69.5 mm
DIA MoM: 0.74
DIA Value: 117 pg/mL
Estriol, Unconjugated: 1.54 ng/mL
Gest. Age on Collection Date: 13 weeks
Gestational Age: 17 weeks
Maternal Age at EDD: 29.5 yr
Nuchal Translucency (NT): 1.6 mm
Nuchal Translucency MoM: 1.02
Number of Fetuses: 1
PAPP-A MoM: 0.5
PAPP-A Value: 597.6 ng/mL
Test Results:: NEGATIVE
Weight: 146 [lb_av]
Weight: 146 [lb_av]
hCG MoM: 0.8
hCG Value: 25.7 IU/mL
uE3 MoM: 1.29

## 2021-02-26 ENCOUNTER — Encounter: Payer: Self-pay | Admitting: Women's Health

## 2021-03-09 NOTE — L&D Delivery Note (Addendum)
OB/GYN Faculty Practice Delivery Note  Daisy Ford is a 31 y.o. [phone] at [redacted]w[redacted]d admitted for SOL.   GBS Status: Negative/-- (04/21 1500) Maximum Maternal Temperature: 98.5  Labor course: Initial SVE: 2 cm. Augmentation with: N/A. She then progressed to complete.  ROM: 2h 57m with clear; bloody fluid  Birth: At [phone] a viable female was delivered via spontaneous vaginal delivery (Presentation: LOA). Nuchal cord present: Yes  loose, easily reduced over fetal head.  Shoulders and body delivered in usual fashion. Infant placed directly on mom's abdomen for bonding/skin-to-skin, baby dried and stimulated. Cord clamped x 2 after 1 minute and cut by FOB.  Cord blood collected.  The placenta separated spontaneously and delivered via gentle cord traction.  Pitocin infused rapidly IV per protocol.  Fundus firm with massage.  Placenta inspected and appears to be intact with a 3 VC.  Placenta/Cord with the following complications: none.  Cord pH: n/a Sponge and instrument count were correct x2.  Intrapartum complications:  None Anesthesia:  epidural Episiotomy: none Lacerations:  2nd degree and perineal Suture Repair: 3.0 vicryl EBL (mL): 150   Infant: APGAR (1 MIN):  9 APGAR (5 MINS):  9 Infant weight: pending  Mom to postpartum.  Baby to Couplet care / Skin to Skin. Placenta to L&D   Plans to Breast and bottlefeed Contraception: IUD - postplacental > see separate procedure note Circumcision: wants inpatient  Note sent to [facility]: FT for pp visit.  Laury Deep , MSN, CNM 07/30/2021 8:58 AM

## 2021-03-20 ENCOUNTER — Encounter: Payer: Medicaid Other | Admitting: Advanced Practice Midwife

## 2021-03-20 ENCOUNTER — Other Ambulatory Visit: Payer: Medicaid Other

## 2021-04-02 ENCOUNTER — Encounter: Payer: Self-pay | Admitting: Obstetrics & Gynecology

## 2021-04-02 ENCOUNTER — Ambulatory Visit (INDEPENDENT_AMBULATORY_CARE_PROVIDER_SITE_OTHER): Payer: Medicaid Other

## 2021-04-02 ENCOUNTER — Ambulatory Visit (INDEPENDENT_AMBULATORY_CARE_PROVIDER_SITE_OTHER): Payer: Medicaid Other | Admitting: Obstetrics & Gynecology

## 2021-04-02 ENCOUNTER — Other Ambulatory Visit: Payer: Self-pay

## 2021-04-02 VITALS — BP 105/63 | HR 77 | Wt 150.8 lb

## 2021-04-02 DIAGNOSIS — Z3A23 23 weeks gestation of pregnancy: Secondary | ICD-10-CM | POA: Diagnosis not present

## 2021-04-02 DIAGNOSIS — Z3480 Encounter for supervision of other normal pregnancy, unspecified trimester: Secondary | ICD-10-CM

## 2021-04-02 DIAGNOSIS — Z363 Encounter for antenatal screening for malformations: Secondary | ICD-10-CM

## 2021-04-02 NOTE — Progress Notes (Signed)
° °  LOW-RISK PREGNANCY VISIT Patient name: Daisy Ford MRN [id number]  Date of birth: 03/04/1991 Chief Complaint:   Routine Prenatal Visit  History of Present Illness:   Daisy Ford is a 31 y.o. [phone] female at [redacted]w[redacted]d with an Estimated Date of Delivery: 07/24/21 being seen today for ongoing management of a low-risk pregnancy.   Interpreter present for the entire visit.  Depression screen [facility] 2/9 01/15/2021 07/03/2019  Decreased Interest 0 0  Down, Depressed, Hopeless 0 0  PHQ - 2 Score 0 0  Altered sleeping 1 0  Tired, decreased energy 1 1  Change in appetite 0 0  Feeling bad or failure about yourself  0 0  Trouble concentrating 0 0  Moving slowly or fidgety/restless 0 0  Suicidal thoughts 0 0  PHQ-9 Score 2 1    Today she reports no complaints. Contractions: Irritability. Vag. Bleeding: None.  Movement: Present. denies leaking of fluid. Review of Systems:   Pertinent items are noted in HPI Denies abnormal vaginal discharge w/ itching/odor/irritation, headaches, visual changes, shortness of breath, chest pain, abdominal pain, severe nausea/vomiting, or problems with urination or bowel movements unless otherwise stated above. Pertinent History Reviewed:  Reviewed past medical,surgical, social, obstetrical and family history.  Reviewed problem list, medications and allergies.  Physical Assessment:   Vitals:   04/02/21 1634  BP: 105/63  Pulse: 77  Weight: 150 lb 12.8 oz (68.4 kg)  Body mass index is 25.09 kg/m.        Physical Examination:   General appearance: Well appearing, and in no distress  Mental status: Alert, oriented to person, place, and time  Skin: Warm & dry  Respiratory: Normal respiratory effort, no distress  Abdomen: Soft, gravid, nontender  Pelvic: Cervical exam deferred         Extremities: Edema: None  Psych:  mood and affect appropriate  Fetal Status:     Movement: Present    cephalic,cx 3.4 cm,posterior placenta gr 0,normal  ovaries,svp of fluid 5.3 cm,LVEICF 1.9 mm,FHR 154 bpm,EFW 578 g 19%,anatomy complete  Chaperone: n/a    No results found for this or any previous visit (from the past 24 hour(s)).   Assessment & Plan:  1) Low-risk pregnancy [id number] at [redacted]w[redacted]d with an Estimated Date of Delivery: 07/24/21   2) Anatomy scan today- reviewed isolated echogenic foci   Meds: No orders of the defined types were placed in this encounter.  Labs/procedures today: anatomy scan, PN-2 next visit  Plan:  Continue routine obstetrical care  Next visit: prefers in person    Reviewed: Preterm labor symptoms and general obstetric precautions including but not limited to vaginal bleeding, contractions, leaking of fluid and fetal movement were reviewed in detail with the patient.  All questions were answered.  Follow-up: Return in about 4 weeks (around 04/30/2021) for LROB visit, PN-2.  No orders of the defined types were placed in this encounter.   Daisy Hidalgo, DO Attending Obstetrician & Gynecologist, [facility] for [employer], [facility]

## 2021-04-02 NOTE — Progress Notes (Signed)
[place] 23+6 wks,cephalic,cx 3.4 cm,posterior placenta gr 0,normal ovaries,svp of fluid 5.3 cm,LVEICF 1.9 mm,FHR 154 bpm,EFW 578 g 19%,anatomy complete

## 2021-04-30 ENCOUNTER — Encounter: Payer: Self-pay | Admitting: Obstetrics & Gynecology

## 2021-04-30 ENCOUNTER — Other Ambulatory Visit: Payer: Medicaid Other

## 2021-04-30 ENCOUNTER — Other Ambulatory Visit: Payer: Self-pay

## 2021-04-30 ENCOUNTER — Ambulatory Visit (INDEPENDENT_AMBULATORY_CARE_PROVIDER_SITE_OTHER): Payer: Medicaid Other | Admitting: Obstetrics & Gynecology

## 2021-04-30 VITALS — BP 106/64 | HR 85 | Wt 153.4 lb

## 2021-04-30 DIAGNOSIS — Z23 Encounter for immunization: Secondary | ICD-10-CM

## 2021-04-30 DIAGNOSIS — Z3482 Encounter for supervision of other normal pregnancy, second trimester: Secondary | ICD-10-CM

## 2021-04-30 DIAGNOSIS — Z3402 Encounter for supervision of normal first pregnancy, second trimester: Secondary | ICD-10-CM

## 2021-04-30 DIAGNOSIS — Z131 Encounter for screening for diabetes mellitus: Secondary | ICD-10-CM

## 2021-04-30 DIAGNOSIS — Z3A27 27 weeks gestation of pregnancy: Secondary | ICD-10-CM

## 2021-04-30 NOTE — Progress Notes (Signed)
° °  LOW-RISK PREGNANCY VISIT Patient name: Daisy Ford MRN [id number]  Date of birth: 01-17-1991 Chief Complaint:   Routine Prenatal Visit  History of Present Illness:   Daisy Ford is a 31 y.o. [phone] female at [redacted]w[redacted]d with an Estimated Date of Delivery: 07/24/21 being seen today for ongoing management of a low-risk pregnancy.   Interpreter present for entire visit  Depression screen [facility] 2/9 01/15/2021 07/03/2019  Decreased Interest 0 0  Down, Depressed, Hopeless 0 0  PHQ - 2 Score 0 0  Altered sleeping 1 0  Tired, decreased energy 1 1  Change in appetite 0 0  Feeling bad or failure about yourself  0 0  Trouble concentrating 0 0  Moving slowly or fidgety/restless 0 0  Suicidal thoughts 0 0  PHQ-9 Score 2 1    Today she reports no complaints. Contractions: Not present. Vag. Bleeding: None.  Movement: Present. denies leaking of fluid. Review of Systems:   Pertinent items are noted in HPI Denies abnormal vaginal discharge w/ itching/odor/irritation, headaches, visual changes, shortness of breath, chest pain, abdominal pain, severe nausea/vomiting, or problems with urination or bowel movements unless otherwise stated above. Pertinent History Reviewed:  Reviewed past medical,surgical, social, obstetrical and family history.  Reviewed problem list, medications and allergies.  Physical Assessment:   Vitals:   04/30/21 0903  BP: 106/64  Pulse: 85  Weight: 153 lb 6.4 oz (69.6 kg)  Body mass index is 25.53 kg/m.        Physical Examination:   General appearance: Well appearing, and in no distress  Mental status: Alert, oriented to person, place, and time  Skin: Warm & dry  Respiratory: Normal respiratory effort, no distress  Abdomen: Soft, gravid, nontender  Pelvic: Cervical exam deferred         Extremities: Edema: None  Psych:  mood and affect appropriate  Fetal Status: Fetal Heart Rate (bpm): 145-abn rhytthm Fundal Height: 26 cm Movement: Present     Chaperone: n/a    No results found for this or any previous visit (from the past 24 hour(s)).   Assessment & Plan:  1) Low-risk pregnancy [id number] at [redacted]w[redacted]d with an Estimated Date of Delivery: 07/24/21   2) ?FHR abnromality- plan to recheck in 2 weeks- if still notable may consider fetal ECHO   Meds: No orders of the defined types were placed in this encounter.  Labs/procedures today: PN-2  Plan:  Continue routine obstetrical care  Next visit: prefers in person    Reviewed: Preterm labor symptoms and general obstetric precautions including but not limited to vaginal bleeding, contractions, leaking of fluid and fetal movement were reviewed in detail with the patient.  All questions were answered.   Follow-up: Return in about 4 weeks (around 05/28/2021) for LROB visit.  Orders Placed This Encounter  Procedures   Tdap vaccine greater than or equal to 7yo IM    Myna Hidalgo, DO Attending Obstetrician & Gynecologist, [facility] for [employer], [facility]

## 2021-05-01 LAB — CBC
Hematocrit: 38.7 % (ref 34.0–46.6)
Hemoglobin: 12.7 g/dL (ref 11.1–15.9)
MCH: 31.5 pg (ref 26.6–33.0)
MCHC: 32.8 g/dL (ref 31.5–35.7)
MCV: 96 fL (ref 79–97)
Platelets: 275 10*3/uL (ref 150–450)
RBC: 4.03 x10E6/uL (ref 3.77–5.28)
RDW: 12.6 % (ref 11.7–15.4)
WBC: 8.7 10*3/uL (ref 3.4–10.8)

## 2021-05-01 LAB — HIV ANTIBODY (ROUTINE TESTING W REFLEX): HIV Screen 4th Generation wRfx: NONREACTIVE

## 2021-05-01 LAB — RPR: RPR Ser Ql: NONREACTIVE

## 2021-05-01 LAB — GLUCOSE TOLERANCE, 2 HOURS W/ 1HR
Glucose, 1 hour: 91 mg/dL (ref 70–179)
Glucose, 2 hour: 133 mg/dL (ref 70–152)
Glucose, Fasting: 72 mg/dL (ref 70–91)

## 2021-05-01 LAB — ANTIBODY SCREEN: Antibody Screen: NEGATIVE

## 2021-05-07 ENCOUNTER — Other Ambulatory Visit (HOSPITAL_COMMUNITY)
Admission: RE | Admit: 2021-05-07 | Discharge: 2021-05-07 | Disposition: A | Discharge status: Home / Self Care | Payer: Medicaid Other | Source: Ambulatory Visit | Attending: Advanced Practice Midwife | Admitting: Advanced Practice Midwife

## 2021-05-07 ENCOUNTER — Other Ambulatory Visit: Payer: Self-pay

## 2021-05-07 ENCOUNTER — Ambulatory Visit (INDEPENDENT_AMBULATORY_CARE_PROVIDER_SITE_OTHER): Payer: Medicaid Other | Admitting: Advanced Practice Midwife

## 2021-05-07 ENCOUNTER — Encounter: Payer: Self-pay | Admitting: Obstetrics & Gynecology

## 2021-05-07 VITALS — BP 109/62 | HR 93 | Wt 153.0 lb

## 2021-05-07 DIAGNOSIS — N898 Other specified noninflammatory disorders of vagina: Secondary | ICD-10-CM | POA: Insufficient documentation

## 2021-05-07 DIAGNOSIS — Z3480 Encounter for supervision of other normal pregnancy, unspecified trimester: Secondary | ICD-10-CM

## 2021-05-07 DIAGNOSIS — Z3A28 28 weeks gestation of pregnancy: Secondary | ICD-10-CM

## 2021-05-07 MED ORDER — TERCONAZOLE 0.4 % VA CREA: 1.0000 | TOPICAL_CREAM | Freq: Every day | VAGINAL | 0 refills | Status: DC

## 2021-05-07 NOTE — Progress Notes (Signed)
? ?  LOW-RISK PREGNANCY VISIT Spanish interpreter present during entire visit ?Patient name: Daisy Ford MRN [id number]  Date of birth: 05/22/1990 ?Chief Complaint:   ?Vaginal Itching ? ?History of Present Illness:   ?Daisy Ford is a 31 y.o. [phone] female at [redacted]w[redacted]d with an Estimated Date of Delivery: 07/24/21 being seen today for ongoing management of a low-risk pregnancy.  ?Today she reports  having vaginal irritation, itching, and yellow discharge; husband reports some penile itching as well . Contractions: Irritability. Vag. Bleeding: None.  Movement: Present. denies leaking of fluid. ?Review of Systems:   ?Pertinent items are noted in HPI ?Denies abnormal vaginal discharge w/ itching/odor/irritation, headaches, visual changes, shortness of breath, chest pain, abdominal pain, severe nausea/vomiting, or problems with urination or bowel movements unless otherwise stated above. ?Pertinent History Reviewed:  ?Reviewed past medical,surgical, social, obstetrical and family history.  ?Reviewed problem list, medications and allergies. ?Physical Assessment:  ? ?Vitals:  ? 05/07/21 1434  ?BP: 109/62  ?Pulse: 93  ?Weight: 153 lb (69.4 kg)  ?Body mass index is 25.46 kg/m?. ?  ?     Physical Examination:  ? General appearance: Well appearing, and in no distress ? Mental status: Alert, oriented to person, place, and time ? Skin: Warm & dry ? Cardiovascular: Normal heart rate noted ? Respiratory: Normal respiratory effort, no distress ? Abdomen: Soft, gravid, nontender ? Pelvic: Cervical exam deferred; CW swab collected       ? Extremities: Edema: None ? ?Fetal Status: Fetal Heart Rate (bpm): 147   Movement: Present   ? ?No results found for this or any previous visit (from the past 24 hour(s)).  ?Assessment & Plan:  ?1) Low-risk pregnancy [id number] at [redacted]w[redacted]d with an Estimated Date of Delivery: 07/24/21  ? ?2) Vaginal d/c, probable VVC, rx Terazol (or can use generic Monistat 7); will await other results from  CW swab ? ?3) Audible FHR arrhythmia, fetal echo ordered; she states her older son had a murmur at birth that was observed and didn't require a procedure ?  ?Meds:  ?Meds ordered this encounter  ?Medications  ? terconazole (TERAZOL 7) 0.4 % vaginal cream  ?  Sig: Place 1 applicator vaginally at bedtime.  ?  Dispense:  45 g  ?  Refill:  0  ?  Order Specific Question:   Supervising Provider  ?  Answer:   Duane Lope H [[id number]]  ? ?Labs/procedures today: CV swab ? ?Plan:  Continue routine obstetrical care  ? ?Reviewed: Preterm labor symptoms and general obstetric precautions including but not limited to vaginal bleeding, contractions, leaking of fluid and fetal movement were reviewed in detail with the patient.  All questions were answered. Didn't ask about home bp cuff. Check bp weekly, let us know if >140/90.  ? ?Follow-up: Return in about 2 weeks (around 05/21/2021) for LROB, in person. ? ?No orders of the defined types were placed in this encounter. ? ?Arabella Merles CNM ?05/07/2021 ?3:00 PM  ?

## 2021-05-09 ENCOUNTER — Inpatient Hospital Stay (HOSPITAL_COMMUNITY)
Admission: AD | Admit: 2021-05-09 | Discharge: 2021-05-09 | Disposition: A | Discharge status: Home / Self Care | Payer: Medicaid Other | Attending: Obstetrics and Gynecology | Admitting: Obstetrics and Gynecology

## 2021-05-09 ENCOUNTER — Encounter (HOSPITAL_COMMUNITY): Payer: Self-pay | Admitting: Obstetrics & Gynecology

## 2021-05-09 ENCOUNTER — Other Ambulatory Visit: Payer: Self-pay

## 2021-05-09 DIAGNOSIS — O26893 Other specified pregnancy related conditions, third trimester: Secondary | ICD-10-CM | POA: Insufficient documentation

## 2021-05-09 DIAGNOSIS — O479 False labor, unspecified: Secondary | ICD-10-CM

## 2021-05-09 DIAGNOSIS — R109 Unspecified abdominal pain: Secondary | ICD-10-CM | POA: Diagnosis not present

## 2021-05-09 DIAGNOSIS — O4703 False labor before 37 completed weeks of gestation, third trimester: Secondary | ICD-10-CM | POA: Insufficient documentation

## 2021-05-09 DIAGNOSIS — Z3A29 29 weeks gestation of pregnancy: Secondary | ICD-10-CM | POA: Insufficient documentation

## 2021-05-09 LAB — URINALYSIS, ROUTINE W REFLEX MICROSCOPIC
Bilirubin Urine: NEGATIVE
Glucose, UA: NEGATIVE mg/dL
Hgb urine dipstick: NEGATIVE
Ketones, ur: NEGATIVE mg/dL
Leukocytes,Ua: NEGATIVE
Nitrite: NEGATIVE
Protein, ur: NEGATIVE mg/dL
Specific Gravity, Urine: 1.014 (ref 1.005–1.030)
pH: 6 (ref 5.0–8.0)

## 2021-05-09 LAB — CERVICOVAGINAL ANCILLARY ONLY
Bacterial Vaginitis (gardnerella): NEGATIVE
Candida Glabrata: NEGATIVE
Candida Vaginitis: POSITIVE — AB
Chlamydia: NEGATIVE
Comment: NEGATIVE
Comment: NEGATIVE
Comment: NEGATIVE
Comment: NEGATIVE
Comment: NEGATIVE
Comment: NORMAL
Neisseria Gonorrhea: NEGATIVE
Trichomonas: NEGATIVE

## 2021-05-09 MED ORDER — TERBUTALINE SULFATE 1 MG/ML IJ SOLN
0.2500 mg | Freq: Once | INTRAMUSCULAR | Status: AC
  Administered 2021-05-09: 0.25 mg via SUBCUTANEOUS
  Filled 2021-05-09: qty 1

## 2021-05-09 MED ORDER — LACTATED RINGERS IV BOLUS
1000.0000 mL | Freq: Once | INTRAVENOUS | Status: AC
Start: 2021-05-09 — End: 2021-05-09
  Administered 2021-05-09: 1000 mL via INTRAVENOUS

## 2021-05-09 MED ORDER — CYCLOBENZAPRINE HCL 5 MG PO TABS
10.0000 mg | ORAL_TABLET | Freq: Once | ORAL | Status: AC
  Administered 2021-05-09: 10 mg via ORAL
  Filled 2021-05-09: qty 2

## 2021-05-09 MED ORDER — ACETAMINOPHEN 500 MG PO TABS
1000.0000 mg | ORAL_TABLET | Freq: Once | ORAL | Status: AC
  Administered 2021-05-09: 1000 mg via ORAL
  Filled 2021-05-09: qty 2

## 2021-05-09 MED ORDER — CYCLOBENZAPRINE HCL 10 MG PO TABS: 10.0000 mg | ORAL_TABLET | Freq: Two times a day (BID) | ORAL | 0 refills | Status: DC | PRN

## 2021-05-09 MED ORDER — LACTATED RINGERS IV BOLUS
1000.0000 mL | Freq: Once | INTRAVENOUS | Status: AC
  Administered 2021-05-09: 1000 mL via INTRAVENOUS

## 2021-05-09 NOTE — MAU Note (Signed)
Pt reports at 3 am she started having strong pain in her lower back and lower abd, pain got better during the day but is starting to get strong again. Denies bleeding or ROM. Reports positive fetal movement.  ?

## 2021-05-09 NOTE — MAU Provider Note (Signed)
?History  ?  ? ?CSN: [id number] ? ?Arrival date and time: 05/09/21 1612 ? ? Event Date/Time  ? First Provider Initiated Contact with Patient 05/09/21 1632   ?  ? ?Chief Complaint  ?Patient presents with  ? Abdominal Pain  ? ?HPI ?Daisy Ford is a 31 y.o. [phone] at [redacted]w[redacted]d who presents to MAU with chief complaint of preterm contractions. This is a new problem, onset at 0300 today. Pain is located in her lower abdomen and low back. It is better now than at onset but continues to wax and wane. She denies vaginal bleeding, leaking of fluid, decreased fetal movement, fever, falls, or recent illness. She receives care with [facility]. ? ? ?OB History   ? ? Gravida  ?4  ? Para  ?1  ? Term  ?1  ? Preterm  ?   ? AB  ?2  ? Living  ?1  ?  ? ? SAB  ?2  ? IAB  ?   ? Ectopic  ?   ? Multiple  ?0  ? Live Births  ?1  ?   ?  ?  ? ? ?Past Medical History:  ?Diagnosis Date  ? Medical history non-contributory   ? Miscarriage   ? ? ?Past Surgical History:  ?Procedure Laterality Date  ? APPENDECTOMY    ? ? ?Family History  ?Problem Relation Age of Onset  ? Cancer Paternal Grandfather   ?     prostate  ? ? ?Social History  ? ?Tobacco Use  ? Smoking status: Never  ? Smokeless tobacco: Never  ?Vaping Use  ? Vaping Use: Never used  ?Substance Use Topics  ? Alcohol use: No  ? Drug use: No  ? ? ?Allergies:  ?Allergies  ?Allergen Reactions  ? Caffeine Palpitations  ? ? ?Medications Prior to Admission  ?Medication Sig Dispense Refill Last Dose  ? Prenatal Vit-Fe Fumarate-FA (MULTIVITAMIN-PRENATAL) 27-0.8 MG TABS tablet Take 1 tablet by mouth daily at 12 noon.   05/09/2021  ? terconazole (TERAZOL 7) 0.4 % vaginal cream Place 1 applicator vaginally at bedtime. 45 g 0 05/08/2021  ? Blood Pressure Monitor MISC For regular home bp monitoring during pregnancy (Patient not taking: Reported on 04/02/2021) 1 each 0   ? ? ?Review of Systems  ?Gastrointestinal:  Positive for abdominal pain.  ?Musculoskeletal:  Positive for back pain.  ?All other systems  reviewed and are negative. ?Physical Exam  ? ?Blood pressure 115/61, pulse 95, temperature 98.3 ?F (36.8 ?C), temperature source Oral, resp. rate 15, last menstrual period 10/17/2020, SpO2 99 %. ? ?Physical Exam ?Vitals and nursing note reviewed. Exam conducted with a chaperone present.  ?Constitutional:   ?   Appearance: She is well-developed.  ?Cardiovascular:  ?   Rate and Rhythm: Normal rate and regular rhythm.  ?   Heart sounds: Normal heart sounds.  ?Pulmonary:  ?   Effort: Pulmonary effort is normal.  ?   Breath sounds: Normal breath sounds.  ?Abdominal:  ?   Palpations: Abdomen is soft.  ?   Tenderness: There is no abdominal tenderness.  ?   Comments: Gravid  ?Skin: ?   Capillary Refill: Capillary refill takes less than 2 seconds.  ?Neurological:  ?   Mental Status: She is alert and oriented to person, place, and time.  ?Psychiatric:     ?   Mood and Affect: Mood normal.     ?   Behavior: Behavior normal.  ? ? ?MAU Course  ?Procedures: speculum exam ? ?  MDM ? ?Orders Placed This Encounter  ?Procedures  ? Urinalysis, Routine w reflex microscopic Urine, Clean Catch  ? Insert peripheral IV  ? Discharge patient  ? ?--EMR reviewed: ob hx Term SVD x 1, SAB x 2 ?--Reactive tracing: baseline 140, mod var, + accels, no decels ?--Toco: initially q 3-6 min, improving to UI with occasional ctx, unable to palpate s/p medications in MAU ?--1615: Cervix visually closed on exam.  ?--1845: CNM at bedside. Patient reports improved pain score. Cervix closed per my digital exam. Discussed discharge home now or additional fluid bolus and recheck in one hour. Pt requests recheck in one hour and additional fluid bolus ?--1945: CNM returned to bedside. Cervix closed/thick/posterior. Patient verbalizes that she feels safe going home with preterm labor precautions. CNM offered to contact Dr. Vergie Living to ask about overnight observation. Pt declines, requests discharge home ? ?Patient Vitals for the past 24 hrs: ? BP Temp Temp src Pulse  Resp SpO2  ?05/09/21 2011 (!) 114/59 -- -- 91 -- --  ?05/09/21 1945 -- -- -- -- -- 100 %  ?05/09/21 1900 -- -- -- -- -- 100 %  ?05/09/21 1855 -- -- -- -- -- 99 %  ?05/09/21 1851 (!) 112/56 -- -- 87 -- --  ?05/09/21 1835 -- -- -- -- -- 100 %  ?05/09/21 1635 -- -- -- -- -- 98 %  ?05/09/21 1632 115/61 -- -- 93 -- --  ?05/09/21 1631 115/61 98.3 ?F (36.8 ?C) Oral 95 15 99 %  ? ?Meds ordered this encounter  ?Medications  ? acetaminophen (TYLENOL) tablet 1,000 mg  ? cyclobenzaprine (FLEXERIL) tablet 10 mg  ? lactated ringers bolus 1,000 mL  ? terbutaline (BRETHINE) injection 0.25 mg  ? lactated ringers bolus 1,000 mL  ? cyclobenzaprine (FLEXERIL) 10 MG tablet  ?  Sig: Take 1 tablet (10 mg total) by mouth 2 (two) times daily as needed for muscle spasms.  ?  Dispense:  20 tablet  ?  Refill:  0  ?  Order Specific Question:   Supervising Provider  ?  AnswerRandolph Bing [[id number]]  ? ?Assessment and Plan  ?--31 y.o. G4P1021 at [redacted]w[redacted]d  ?--Reactive tracing ?--Cervix remains closed 4.5 hours after initial assessment ?--Pain score 2/10 at time of discharge ?--Language barrier: In-person interpreter Tonna Corner present for all patient interaction ?--Discharge home in stable condition ? ?Calvert Cantor, MSA, MSN, CNM ?05/09/2021, 8:18 PM  ?

## 2021-05-14 ENCOUNTER — Encounter: Payer: Medicaid Other | Admitting: Obstetrics & Gynecology

## 2021-05-21 ENCOUNTER — Encounter: Payer: Self-pay | Admitting: Women's Health

## 2021-05-21 ENCOUNTER — Other Ambulatory Visit: Payer: Self-pay

## 2021-05-21 ENCOUNTER — Ambulatory Visit (INDEPENDENT_AMBULATORY_CARE_PROVIDER_SITE_OTHER): Payer: Medicaid Other | Admitting: Women's Health

## 2021-05-21 VITALS — BP 115/64 | HR 95 | Wt 156.0 lb

## 2021-05-21 DIAGNOSIS — Z3483 Encounter for supervision of other normal pregnancy, third trimester: Secondary | ICD-10-CM

## 2021-05-21 DIAGNOSIS — Z3480 Encounter for supervision of other normal pregnancy, unspecified trimester: Secondary | ICD-10-CM

## 2021-05-21 NOTE — Patient Instructions (Signed)
Daisy Ford, thank you for choosing our office today! We appreciate the opportunity to meet your healthcare needs. You may receive a short survey by mail, e-mail, or through [employer]. If you are happy with your care we would appreciate if you could take just a few minutes to complete the survey questions. We read all of your comments and take your feedback very seriously. Thank you again for choosing our office.  ?Center for [employer] Team at [facility] ? ?Women's & Children's Center at [facility] ?([address] [place], [place] [address]) ?Entrance C, located off of [address] ?Free 24/7 valet parking  ? ?CLASSES: Go to [email or web address] to register for classes (childbirth, breastfeeding, waterbirth, infant CPR, daddy bootcamp, etc.) ? ?Call the office [phone]) or go to [facility] if: ?You begin to have strong, frequent contractions ?Your water breaks.  Sometimes it is a big gush of fluid, sometimes it is just a trickle that keeps getting your panties wet or running down your legs ?You have vaginal bleeding.  It is normal to have a small amount of spotting if your cervix was checked.  ?You don't feel your baby moving like normal.  If you don't, get you something to eat and drink and lay down and focus on feeling your baby move.   If your baby is still not moving like normal, you should call the office or go to [facility]. ? ?Call the office [phone]) or go to [facility] hospital for these signs of pre-eclampsia: ?Severe headache that does not go away with Tylenol ?Visual changes- seeing spots, double, blurred vision ?Pain under your right breast or upper abdomen that does not go away with Tums or heartburn medicine ?Nausea and/or vomiting ?Severe swelling in your hands, feet, and face  ? ?Tdap Vaccine ?It is recommended that you get the Tdap vaccine during the third trimester of EACH pregnancy to help protect your baby from getting pertussis (whooping cough) ?27-36 weeks is the BEST time to do  this so that you can pass the protection on to your baby. During pregnancy is better than after pregnancy, but if you are unable to get it during pregnancy it will be offered at the hospital.  ?You can get this vaccine with [place], at the health department, your family doctor, or some local pharmacies ?Everyone who will be around your baby should also be up-to-date on their vaccines before the baby comes. Adults (who are not pregnant) only need 1 dose of Tdap during adulthood.  ? ?[place] Pediatricians/Family Doctors ?[facility]): [address], [phone]           ?[facility]: [address], [phone]                ?[facility]): [address], [phone] (call to ask if accepting patients) ?[facility] Department: [address], [place], [phone]   ? ?[place] Pediatricians/Family Doctors ?[facility]): [address], [phone] ?[facility]: [address] [place], [phone] ?[facility]: [address], [phone] ? ?[facility] [employer] [facility] Family Doctors  ?[facility]): [phone] ?[facility]: [address], [phone]  ? ?[facility] Family [facility] ?[facility]: [address] [phone] ? ?[facility]  ?[facility]: [phone], [phone] ? ?Home Blood Pressure Monitoring for Patients  ? ?Your provider has recommended that you check your  blood pressure (BP) at least once a week at home. If you do not have a blood pressure cuff at home, one will be provided for you. Contact your provider if you have not received your monitor within 1 week.  ? ?Helpful Tips for Accurate Home Blood Pressure Checks  ?Don't smoke, exercise, or drink caffeine 30 minutes before checking your BP ?Use the restroom before checking your BP (a full bladder can raise your  pressure) ?Relax in a comfortable upright chair ?Feet on the ground ?Left arm resting comfortably on a flat surface at the level of your heart ?Legs uncrossed ?Back supported ?Sit quietly and don't talk ?Place the cuff on your bare arm ?Adjust snuggly, so that only two fingertips can fit between your skin and the top of the cuff ?Check 2 readings separated by at least one minute ?Keep a log of your BP readings ?For a visual, please reference this diagram: http://ccnc.care/bpdiagram ? ?Provider Name: [facility]     Phone: [phone] ? ?Zone 1: ALL CLEAR  ?Continue to monitor your symptoms:  ?BP reading is less than 140 (top number) or less than 90 (bottom number)  ?No right upper stomach pain ?No headaches or seeing spots ?No feeling nauseated or throwing up ?No swelling in face and hands ? ?Zone 2: CAUTION ?Call your doctor's office for any of the following:  ?BP reading is greater than 140 (top number) or greater than 90 (bottom number)  ?Stomach pain under your ribs in the middle or right side ?Headaches or seeing spots ?Feeling nauseated or throwing up ?Swelling in face and hands ? ?Zone 3: EMERGENCY  ?Seek immediate medical care if you have any of the following:  ?BP reading is greater than160 (top number) or greater than 110 (bottom number) ?Severe headaches not improving with Tylenol ?Serious difficulty catching your breath ?Any worsening symptoms from Zone 2  ?Parto prematuro ?Preterm Labor ?La duraci?n normal de un embarazo es de 39 a 41 semanas. Se llama trabajo de parto prematuro cuando este se inicia antes de las 37 semanas de [place]. Los beb?s que nacen de forma prematura y sobreviven pueden no estar completamente desarrollados y correr un mayor riesgo de tener problemas a largo plazo, como par?lisis cerebral, retrasos en el desarrollo y problemas de la vista y el o?do. ?Los beb?s que nacen antes de tiempo pueden tener problemas poco despu?s del nacimiento. Los beb?s prematuros pueden estar  relacionados con la regulaci?n del az?car en la sangre, la temperatura corporal, la frecuencia card?aca y Engineer, manufacturing systems. Estos beb?s suelen tener problemas para alimentarse. El riesgo de tener problemas es mayor para los beb?s que nacen antes de las 34 semanas de [place]. ??Cu?les son las causas? ?Se desconoce la causa exacta de esta afecci?n. ??Qu? incrementa el riesgo? ?Es m?s probable que tenga un parto prematuro si tiene ciertos factores de riesgo que guardan relaci?n con sus antecedentes m?dicos, problemas en el embarazo en curso y Manufacturing engineer, y factores relacionados con el estilo de vida. ?Antecedentes m?dicos ?Tiene anormalidades en el ?tero, por ejemplo, el cuello uterino corto. ?Tiene ITS (infecciones de transmisi?n sexual) u otras infecciones en las v?as urinarias y la vagina. ?Tiene enfermedades cr?nicas, como problemas de coagulaci?n sangu?nea, diabetes o hipertensi?n arterial. ?Tiene sobrepeso o bajo peso. ?Embarazo en curso y Proofreader ?Ha tenido trabajo de parto prematuro anteriormente. ?Tiene un embarazo gemelar o m?ltiple. ?Le han diagnosticado una afecci?n en la que la placenta cubre el cuello uterino (placenta previa). ?Esper? menos de  18 meses entre un parto y un Psychologist, occupational. ?El beb? en gestaci?n tiene algunas anormalidades. ?Tiene sangrado vaginal durante el embarazo. ?Qued? embarazada a trav?s de la fertilizaci?n in vitro (FIV). ?Factores relacionados con el ambiente y el estilo de vida ?Consume productos que contienen tabaco o toma bebidas alcoh?licas. ?Consume drogas. ?Tiene estr?s y no cuenta con apoyo social. ?Sufre violencia dom?stica. ?Est? expuesta a ciertas sustancias qu?micas o contaminantes ambientales. ?Otros factores ?Es Adult nurse de 17 a?os o mayor de 35 a?os de edad. ??Cu?les son los signos o s?ntomas? ?Los s?ntomas de esta afecci?n incluyen: ?Calambres similares a los que ocurren durante el per?odo menstrual. Los calambres pueden presentarse con  diarrea. ?Dolor en el abdomen o en la parte inferior de la espalda. ?Contracciones regulares que se pueden sentir como un endurecimiento del abdomen. ?Una sensaci?n de mayor presi?n en la pelvis. ?Aumento de l

## 2021-05-21 NOTE — Progress Notes (Signed)
? ? ?LOW-RISK PREGNANCY VISIT ?Patient name: Daisy Ford MRN [id number]  Date of birth: 06-02-1990 ?Chief Complaint:   ?Routine Prenatal Visit ? ?History of Present Illness:   ?Daisy Ford is a 31 y.o. [id number] female at [redacted]w[redacted]d with an Estimated Date of Delivery: 07/24/21 being seen today for ongoing management of a low-risk pregnancy.  ? ?Today she reports no complaints. Has appt for fetal echo in April for arrhythmia.  Contractions: Irritability. Vag. Bleeding: None.  Movement: Present. denies leaking of fluid. ? ?Depression screen [facility] 2/9 01/15/2021 07/03/2019  ?Decreased Interest 0 0  ?Down, Depressed, Hopeless 0 0  ?PHQ - 2 Score 0 0  ?Altered sleeping 1 0  ?Tired, decreased energy 1 1  ?Change in appetite 0 0  ?Feeling bad or failure about yourself  0 0  ?Trouble concentrating 0 0  ?Moving slowly or fidgety/restless 0 0  ?Suicidal thoughts 0 0  ?PHQ-9 Score 2 1  ? ?  ?GAD 7 : Generalized Anxiety Score 01/15/2021 07/03/2019  ?Nervous, Anxious, on Edge 0 0  ?Control/stop worrying 0 0  ?Worry too much - different things 0 0  ?Trouble relaxing 0 0  ?Restless 0 0  ?Easily annoyed or irritable 0 0  ?Afraid - awful might happen 0 0  ?Total GAD 7 Score 0 0  ? ? ?  ?Review of Systems:   ?Pertinent items are noted in HPI ?Denies abnormal vaginal discharge w/ itching/odor/irritation, headaches, visual changes, shortness of breath, chest pain, abdominal pain, severe nausea/vomiting, or problems with urination or bowel movements unless otherwise stated above. ?Pertinent History Reviewed:  ?Reviewed past medical,surgical, social, obstetrical and family history.  ?Reviewed problem list, medications and allergies. ?Physical Assessment:  ? ?Vitals:  ? 05/21/21 1209  ?BP: 115/64  ?Pulse: 95  ?Weight: 156 lb (70.8 kg)  ?Body mass index is 25.96 kg/m?. ?  ?     Physical Examination:  ? General appearance: Well appearing, and in no distress ? Mental status: Alert, oriented to person, place, and time ? Skin: Warm &  dry ? Cardiovascular: Normal heart rate noted ? Respiratory: Normal respiratory effort, no distress ? Abdomen: Soft, gravid, nontender ? Pelvic: Cervical exam deferred        ? Extremities: Edema: None ? ?Fetal Status: Fetal Heart Rate (bpm): 140 Fundal Height: 28 cm Movement: Present  , no arrhythmia heard ? ?Chaperone: N/A   ?No results found for this or any previous visit (from the past 24 hour(s)).  ?Assessment & Plan:  ?1) Low-risk pregnancy [id number] at [redacted]w[redacted]d with an Estimated Date of Delivery: 07/24/21  ? ?2) Prior fetal arrhythmia, none heard today, keep appt for fetal echo in April ?  ?Meds: No orders of the defined types were placed in this encounter. ? ?Labs/procedures today: none ? ?Plan:  Continue routine obstetrical care  ?Next visit: prefers in person   ? ?Reviewed: Preterm labor symptoms and general obstetric precautions including but not limited to vaginal bleeding, contractions, leaking of fluid and fetal movement were reviewed in detail with the patient.  All questions were answered. Does have home bp cuff. Office bp cuff given: not applicable. Check bp weekly, let us know if consistently >140 and/or >90. ? ?Follow-up: Return in about 2 weeks (around 06/04/2021) for [place], [place], in person. ? ?Future Appointments  ?Date Time Provider [place]  ?05/28/2021  1:50 PM Myrtis Ser, CNM [facility]  ? ? ?No orders of the defined types were placed in this encounter. ? ?Royetta Crochet  Gaspar Garbe, WHNP-BC ?05/21/2021 ?12:56 PM  ?

## 2021-05-28 ENCOUNTER — Encounter: Payer: Medicaid Other | Admitting: Advanced Practice Midwife

## 2021-06-04 ENCOUNTER — Encounter: Payer: Medicaid Other | Admitting: Advanced Practice Midwife

## 2021-06-05 ENCOUNTER — Encounter: Payer: Self-pay | Admitting: Women's Health

## 2021-06-05 ENCOUNTER — Ambulatory Visit (INDEPENDENT_AMBULATORY_CARE_PROVIDER_SITE_OTHER): Payer: Medicaid Other | Admitting: Women's Health

## 2021-06-05 VITALS — BP 117/65 | HR 92 | Wt 159.0 lb

## 2021-06-05 DIAGNOSIS — Z3483 Encounter for supervision of other normal pregnancy, third trimester: Secondary | ICD-10-CM

## 2021-06-05 DIAGNOSIS — O0993 Supervision of high risk pregnancy, unspecified, third trimester: Secondary | ICD-10-CM

## 2021-06-05 MED ORDER — PANTOPRAZOLE SODIUM 20 MG PO TBEC: 20.0000 mg | DELAYED_RELEASE_TABLET | Freq: Every day | ORAL | 2 refills | Status: DC

## 2021-06-05 NOTE — Patient Instructions (Signed)
Daisy Ford, thank you for choosing our office today! We appreciate the opportunity to meet your healthcare needs. You may receive a short survey by mail, e-mail, or through [employer]. If you are happy with your care we would appreciate if you could take just a few minutes to complete the survey questions. We read all of your comments and take your feedback very seriously. Thank you again for choosing our office.  ?Center for [employer] Team at [facility] ? ?Women's & Children's Center at [facility] ?([address] [place], [place] [address]) ?Entrance C, located off of [address] ?Free 24/7 valet parking  ? ?CLASSES: Go to [email or web address] to register for classes (childbirth, breastfeeding, waterbirth, infant CPR, daddy bootcamp, etc.) ? ?Call the office [phone]) or go to [facility] if: ?You begin to have strong, frequent contractions ?Your water breaks.  Sometimes it is a big gush of fluid, sometimes it is just a trickle that keeps getting your panties wet or running down your legs ?You have vaginal bleeding.  It is normal to have a small amount of spotting if your cervix was checked.  ?You don't feel your baby moving like normal.  If you don't, get you something to eat and drink and lay down and focus on feeling your baby move.   If your baby is still not moving like normal, you should call the office or go to [facility]. ? ?Call the office [phone]) or go to [facility] hospital for these signs of pre-eclampsia: ?Severe headache that does not go away with Tylenol ?Visual changes- seeing spots, double, blurred vision ?Pain under your right breast or upper abdomen that does not go away with Tums or heartburn medicine ?Nausea and/or vomiting ?Severe swelling in your hands, feet, and face  ? ?Tdap Vaccine ?It is recommended that you get the Tdap vaccine during the third trimester of EACH pregnancy to help protect your baby from getting pertussis (whooping cough) ?27-36 weeks is the BEST time to do  this so that you can pass the protection on to your baby. During pregnancy is better than after pregnancy, but if you are unable to get it during pregnancy it will be offered at the hospital.  ?You can get this vaccine with [place], at the health department, your family doctor, or some local pharmacies ?Everyone who will be around your baby should also be up-to-date on their vaccines before the baby comes. Adults (who are not pregnant) only need 1 dose of Tdap during adulthood.  ? ?[place] Pediatricians/Family Doctors ?[facility]): [address], [phone]           ?[facility]: [address], [phone]                ?[facility]): [address], [phone] (call to ask if accepting patients) ?[facility] Department: [address], [place], [phone]   ? ?[place] Pediatricians/Family Doctors ?[facility]): [address], [phone] ?[facility]: [address] [place], [phone] ?[facility]: [address], [phone] ? ?[employer] Family Doctors  ?[facility]): [phone] ?[facility]: [address], [phone]  ? ?[facility] ?[facility]: [address] [phone] ? ?[facility]  ?[facility]: [phone], [phone] ? ?Home Blood Pressure Monitoring for Patients  ? ?Your provider has recommended that you check your  blood pressure (BP) at least once a week at home. If you do not have a blood pressure cuff at home, one will be provided for you. Contact your provider if you have not received your monitor within 1 week.  ? ?Helpful Tips for Accurate Home Blood Pressure Checks  ?Don't smoke, exercise, or drink caffeine 30 minutes before checking your BP ?Use the restroom before checking your BP (a full bladder can raise your  pressure) ?Relax in a comfortable upright chair ?Feet on the ground ?Left arm resting comfortably on a flat surface at the level of your heart ?Legs uncrossed ?Back supported ?Sit quietly and don't talk ?Place the cuff on your bare arm ?Adjust snuggly, so that only two fingertips can fit between your skin and the top of the cuff ?Check 2 readings separated by at least one minute ?Keep a log of your BP readings ?For a visual, please reference this diagram: http://ccnc.care/bpdiagram ? ?Provider Name: [facility]     Phone: [phone] ? ?Zone 1: ALL CLEAR  ?Continue to monitor your symptoms:  ?BP reading is less than 140 (top number) or less than 90 (bottom number)  ?No right upper stomach pain ?No headaches or seeing spots ?No feeling nauseated or throwing up ?No swelling in face and hands ? ?Zone 2: CAUTION ?Call your doctor's office for any of the following:  ?BP reading is greater than 140 (top number) or greater than 90 (bottom number)  ?Stomach pain under your ribs in the middle or right side ?Headaches or seeing spots ?Feeling nauseated or throwing up ?Swelling in face and hands ? ?Zone 3: EMERGENCY  ?Seek immediate medical care if you have any of the following:  ?BP reading is greater than160 (top number) or greater than 110 (bottom number) ?Severe headaches not improving with Tylenol ?Serious difficulty catching your breath ?Any worsening symptoms from Zone 2  ?Preterm Labor and Birth Information ? ?The normal length of a pregnancy is 39-41 weeks. Preterm labor is when labor starts before 37 completed weeks of pregnancy. ?What are the risk factors for preterm labor? ?Preterm labor is more likely to occur in women who: ?Have certain infections during pregnancy such as a bladder infection, sexually transmitted infection, or infection inside the uterus (chorioamnionitis). ?Have a shorter-than-normal cervix. ?Have gone into preterm labor before. ?Have had surgery on their cervix. ?Are younger than age 72  or older than age 108. ?Are African American. ?Are pregnant with twins or multiple babies (multiple gestation). ?Take street drugs or smoke while pregnant. ?Do not gain enough weight while pregnant. ?Became pregnant shortly after having been pregnant. ?What are the symptoms of preterm labor? ?Symptoms of preterm labor include: ?Cramps similar to those that can happen during a menstrual period. The cramps may happen with diarrhea. ?Pain in the abdomen or lower back. ?Regular uterine contractions that may feel like tightening of the abdomen. ?A feeling of increased pressure in the pelvis. ?Increased watery or bloody mucus discharge from the vagina. ?Water breaking (ruptured amniotic sac). ?Why is it important to recognize signs of preterm labor? ?It is important to recognize signs of preterm labor because babies who are born prematurely may not be fully developed. This can put them at an increased risk for: ?Long-term (chronic) heart and lung problems. ?Difficulty immediately after birth with regulating body systems, including blood sugar, body temperature, heart rate, and breathing rate. ?Bleeding in the brain. ?Cerebral palsy. ?Learning difficulties. ?Death. ?These risks are highest for babies who are born before 11 weeks  of pregnancy. ?How is preterm labor treated? ?Treatment depends on the length of your pregnancy, your condition, and the health of your baby. It may involve: ?Having a stitch (suture) placed in your cervix to prevent your cervix from opening too early (cerclage). ?Taking or being given medicines, such as: ?Hormone medicines. These may be given early in pregnancy to help support the pregnancy. ?Medicine to stop contractions. ?Medicines to help mature the baby?s lungs. These may be prescribed if the risk of delivery is high. ?Medicines to prevent your baby from developing cerebral palsy. ?If the labor happens before 34 weeks of pregnancy, you may need to stay in the hospital. ?What should I do if I  think I am in preterm labor? ?If you think that you are going into preterm labor, call your health care provider right away. ?How can I prevent preterm labor in future pregnancies? ?To increase your chance o

## 2021-06-05 NOTE — Progress Notes (Signed)
? ? ?LOW-RISK PREGNANCY VISIT ?Patient name: Daisy Ford MRN [id number]  Date of birth: 21-Apr-1990 ?Chief Complaint:   ?Routine Prenatal Visit ? ?History of Present Illness:   ?Daisy Ford is a 31 y.o. [phone] female at [redacted]w[redacted]d with an Estimated Date of Delivery: 07/24/21 being seen today for ongoing management of a low-risk pregnancy.  ? ?Today she reports heartburn/reflux, TUMS not helping. Contractions: Not present. Vag. Bleeding: None.  Movement: Present. denies leaking of fluid. ? ? ?  01/15/2021  ?  2:57 PM 07/03/2019  ?  2:35 PM  ?Depression screen PHQ 2/9  ?Decreased Interest 0 0  ?Down, Depressed, Hopeless 0 0  ?PHQ - 2 Score 0 0  ?Altered sleeping 1 0  ?Tired, decreased energy 1 1  ?Change in appetite 0 0  ?Feeling bad or failure about yourself  0 0  ?Trouble concentrating 0 0  ?Moving slowly or fidgety/restless 0 0  ?Suicidal thoughts 0 0  ?PHQ-9 Score 2 1  ? ?  ? ?  01/15/2021  ?  2:57 PM 07/03/2019  ?  2:35 PM  ?GAD 7 : Generalized Anxiety Score  ?Nervous, Anxious, on Edge 0 0  ?Control/stop worrying 0 0  ?Worry too much - different things 0 0  ?Trouble relaxing 0 0  ?Restless 0 0  ?Easily annoyed or irritable 0 0  ?Afraid - awful might happen 0 0  ?Total GAD 7 Score 0 0  ? ? ?  ?Review of Systems:   ?Pertinent items are noted in HPI ?Denies abnormal vaginal discharge w/ itching/odor/irritation, headaches, visual changes, shortness of breath, chest pain, abdominal pain, severe nausea/vomiting, or problems with urination or bowel movements unless otherwise stated above. ?Pertinent History Reviewed:  ?Reviewed past medical,surgical, social, obstetrical and family history.  ?Reviewed problem list, medications and allergies. ?Physical Assessment:  ? ?Vitals:  ? 06/05/21 1346  ?BP: 117/65  ?Pulse: 92  ?Weight: 159 lb (72.1 kg)  ?Body mass index is 26.46 kg/m?. ?  ?     Physical Examination:  ? General appearance: Well appearing, and in no distress ? Mental status: Alert, oriented to person,  place, and time ? Skin: Warm & dry ? Cardiovascular: Normal heart rate noted ? Respiratory: Normal respiratory effort, no distress ? Abdomen: Soft, gravid, nontender ? Pelvic: Cervical exam deferred        ? Extremities: Edema: None ? ?Fetal Status: Fetal Heart Rate (bpm): 146 Fundal Height: 31 cm Movement: Present   ? ?Chaperone: N/A   ?No results found for this or any previous visit (from the past 24 hour(s)).  ?Assessment & Plan:  ?1) Low-risk pregnancy G4P1021 at [redacted]w[redacted]d with an Estimated Date of Delivery: 07/24/21  ? ?2) FHR arrhythmia noted at previous visit, sounds normal today, keep fetal echo on 4/13 as scheduled ? ?3) Reflux> rx protonix ?  ?Meds:  ?Meds ordered this encounter  ?Medications  ? pantoprazole (PROTONIX) 20 MG tablet  ?  Sig: Take 1 tablet (20 mg total) by mouth daily.  ?  Dispense:  30 tablet  ?  Refill:  2  ?  Order Specific Question:   Supervising Provider  ?  Answer:   Duane Lope H [[id number]]  ? ?Labs/procedures today: none ? ?Plan:  Continue routine obstetrical care  ?Next visit: prefers in person   ? ?Reviewed: Preterm labor symptoms and general obstetric precautions including but not limited to vaginal bleeding, contractions, leaking of fluid and fetal movement were reviewed in detail with the patient.  All  questions were answered. Does have home bp cuff. Office bp cuff given: not applicable. Check bp weekly, let us know if consistently >140 and/or >90. ? ?Follow-up: Return in about 2 weeks (around 06/19/2021) for LROB, CNM, in person; then weekly LROB w/ CNM in person. ? ?No future appointments. ? ?No orders of the defined types were placed in this encounter. ? ?Cheral Marker CNM, WHNP-BC ?06/05/2021 ?1:57 PM  ?

## 2021-06-19 ENCOUNTER — Encounter: Payer: Self-pay | Admitting: Obstetrics & Gynecology

## 2021-06-19 ENCOUNTER — Ambulatory Visit (INDEPENDENT_AMBULATORY_CARE_PROVIDER_SITE_OTHER): Payer: Medicaid Other | Admitting: Obstetrics & Gynecology

## 2021-06-19 VITALS — BP 112/64 | HR 97 | Wt 161.0 lb

## 2021-06-19 DIAGNOSIS — Z3403 Encounter for supervision of normal first pregnancy, third trimester: Secondary | ICD-10-CM

## 2021-06-19 NOTE — Progress Notes (Signed)
? ?  LOW-RISK PREGNANCY VISIT ?Patient name: Daisy Ford MRN [id number]  Date of birth: 1990-09-06 ?Chief Complaint:   ?Routine Prenatal Visit ? ?History of Present Illness:   ?Daisy Ford is a 31 y.o. [phone] female at [redacted]w[redacted]d with an Estimated Date of Delivery: 07/24/21 being seen today for ongoing management of a low-risk pregnancy.  ? ?Fetal arrhythmia noted ? ?  01/15/2021  ?  2:57 PM 07/03/2019  ?  2:35 PM  ?Depression screen PHQ 2/9  ?Decreased Interest 0 0  ?Down, Depressed, Hopeless 0 0  ?PHQ - 2 Score 0 0  ?Altered sleeping 1 0  ?Tired, decreased energy 1 1  ?Change in appetite 0 0  ?Feeling bad or failure about yourself  0 0  ?Trouble concentrating 0 0  ?Moving slowly or fidgety/restless 0 0  ?Suicidal thoughts 0 0  ?PHQ-9 Score 2 1  ? ? ?Today she reports no complaints. Contractions: Irritability. Vag. Bleeding: None.  Movement: Present. denies leaking of fluid. ?Review of Systems:   ?Pertinent items are noted in HPI ?Denies abnormal vaginal discharge w/ itching/odor/irritation, headaches, visual changes, shortness of breath, chest pain, abdominal pain, severe nausea/vomiting, or problems with urination or bowel movements unless otherwise stated above. ?Pertinent History Reviewed:  ?Reviewed past medical,surgical, social, obstetrical and family history.  ?Reviewed problem list, medications and allergies. ? ?Physical Assessment:  ? ?Vitals:  ? 06/19/21 1353  ?BP: 112/64  ?Pulse: 97  ?Weight: 161 lb (73 kg)  ?Body mass index is 26.79 kg/m?. ?  ?     Physical Examination:  ? General appearance: Well appearing, and in no distress ? Mental status: Alert, oriented to person, place, and time ? Skin: Warm & dry ? Respiratory: Normal respiratory effort, no distress ? Abdomen: Soft, gravid, nontender ? Pelvic: Cervical exam deferred        ? Extremities: Edema: Trace ? Psych:  mood and affect appropriate ? ?Fetal Status: Fetal Heart Rate (bpm): 150 Fundal Height: 31 cm Movement: Present    ? ?Chaperone: n/a   ? ?No results found for this or any previous visit (from the past 24 hour(s)).  ? ?Assessment & Plan:  ?1) Low-risk pregnancy G4P1021 at [redacted]w[redacted]d with an Estimated Date of Delivery: 07/24/21  ? ?2) Small for dates , growth scan next available ? ?3) Feal arrhythmia- echo completed ?  ?Meds: No orders of the defined types were placed in this encounter. ? ?Labs/procedures today: none ? ?Plan:  Continue routine obstetrical care  ?Next visit: prefers in person   ? ?Reviewed: Preterm labor symptoms and general obstetric precautions including but not limited to vaginal bleeding, contractions, leaking of fluid and fetal movement were reviewed in detail with the patient.  All questions were answered.  ? ?Follow-up: Return in 1 week (on 06/26/2021) for please add growth scan next available. ? ?No orders of the defined types were placed in this encounter. ? ? ?Myna Hidalgo, DO ?Attending Obstetrician & Gynecologist, Faculty Practice ?Center for [employer], [facility] ? ? ? ?

## 2021-06-26 ENCOUNTER — Encounter: Payer: Medicaid Other | Admitting: Obstetrics & Gynecology

## 2021-06-26 ENCOUNTER — Other Ambulatory Visit: Payer: Self-pay | Admitting: Obstetrics & Gynecology

## 2021-06-26 DIAGNOSIS — O365939 Maternal care for other known or suspected poor fetal growth, third trimester, other fetus: Secondary | ICD-10-CM

## 2021-06-27 ENCOUNTER — Other Ambulatory Visit (HOSPITAL_COMMUNITY)
Admission: RE | Admit: 2021-06-27 | Discharge: 2021-06-27 | Disposition: A | Discharge status: Home / Self Care | Payer: Medicaid Other | Source: Ambulatory Visit | Attending: Obstetrics & Gynecology | Admitting: Obstetrics & Gynecology

## 2021-06-27 ENCOUNTER — Ambulatory Visit (INDEPENDENT_AMBULATORY_CARE_PROVIDER_SITE_OTHER): Payer: Medicaid Other | Admitting: Obstetrics and Gynecology

## 2021-06-27 ENCOUNTER — Encounter: Payer: Self-pay | Admitting: Obstetrics and Gynecology

## 2021-06-27 ENCOUNTER — Encounter: Payer: Medicaid Other | Admitting: Obstetrics & Gynecology

## 2021-06-27 ENCOUNTER — Ambulatory Visit (INDEPENDENT_AMBULATORY_CARE_PROVIDER_SITE_OTHER): Payer: Medicaid Other

## 2021-06-27 VITALS — BP 107/64 | HR 90 | Wt 162.4 lb

## 2021-06-27 DIAGNOSIS — O365939 Maternal care for other known or suspected poor fetal growth, third trimester, other fetus: Secondary | ICD-10-CM | POA: Diagnosis not present

## 2021-06-27 DIAGNOSIS — Z789 Other specified health status: Secondary | ICD-10-CM

## 2021-06-27 DIAGNOSIS — Z3480 Encounter for supervision of other normal pregnancy, unspecified trimester: Secondary | ICD-10-CM

## 2021-06-27 DIAGNOSIS — Z3A36 36 weeks gestation of pregnancy: Secondary | ICD-10-CM | POA: Insufficient documentation

## 2021-06-27 DIAGNOSIS — Z758 Other problems related to medical facilities and other health care: Secondary | ICD-10-CM

## 2021-06-27 NOTE — Progress Notes (Signed)
[place] 36+1 wks,cephalic,posterior placenta gr 2,AFI 12.6 cm,fhr 159 bpm,EFW 2848 g 50%,FL 5% ?

## 2021-06-27 NOTE — Patient Instructions (Signed)
Parto vaginal Vaginal Delivery  Parto vaginal significa que usted da a luz empujando al beb fuera del canal del parto (vagina). Su equipo de atencin mdica la ayudar antes, durante y despus del parto vaginal. Las experiencias de los nacimientos son nicas para todas las mujeres, y cada embarazo y las experiencias de nacimiento varan segn dnde elija dar a luz. Cules son los riesgos y beneficios? En general, esto es seguro. Sin embargo, pueden ocurrir complicaciones, por ejemplo: Sangrado. Infeccin. Dao a otras estructuras, como desgarro vaginal. Reacciones alrgicas a los medicamentos. A pesar de los riesgos, los beneficios del parto vaginal incluyen un menor riesgo de sangrado e infeccin y un menor tiempo de recuperacin en comparacin con el parto por cesrea. El parto por cesrea es el parto del beb por medios quirrgicos. Qu ocurrir cuando llegue al centro de parto o al hospital? Una vez que se inicie el trabajo de parto y haya sido admitida en el hospital o centro de parto, su equipo de atencin mdica podr hacer lo siguiente: Revisar sus antecedentes de embarazo y cualquier inquietud que usted pueda tener. Hablar con usted sobre su plan de nacimiento y analizar las opciones para controlar el dolor. Controlarle la presin arterial, la respiracin y los latidos cardacos. Evaluar los latidos cardacos del beb. Monitorear el tero para detectar contracciones. Verificar si la bolsa de agua (saco amnitico) se ha roto (ruptura). Colocarle una va intravenosa en una de las venas. Esto se podr usar para administrarle lquidos y medicamentos. Monitoreo Su equipo de atencin mdica puede evaluar las contracciones (monitoreo uterino) y la frecuencia cardaca del beb (monitoreo fetal). Es posible que el monitoreo se necesite realizar: Con frecuencia, pero no continuamente (intermitentemente). Todo el tiempo o durante largos perodos a la vez (continuamente). El monitoreo  continuo puede ser necesario si: Est recibiendo determinados medicamentos, tales como medicamentos para aliviar el dolor o para hacer que las contracciones sean ms fuertes. Tiene complicaciones durante el embarazo o el trabajo de parto. El monitoreo se puede realizar: Al colocar un estetoscopio especial o un dispositivo manual de monitoreo en el abdomen o verificar los latidos cardacos del beb y comprobar las contracciones. Al colocar monitores en el abdomen (monitores externos) para registrar los latidos cardacos del beb y la frecuencia y duracin de las contracciones. Al colocar monitores dentro del tero a travs de la vagina (monitores internos) para registrar los latidos cardacos del beb y la frecuencia, duracin y fuerza de sus contracciones. Segn el tipo de monitor, puede permanecer en el tero o en la cabeza del beb hasta el nacimiento. Telemetra. Se trata de un tipo de monitoreo continuo que se puede realizar con monitores externos o internos. En lugar de tener que permanecer en la cama, usted puede desplazarse. Examen fsico Su equipo de atencin mdica puede realizar exmenes fsicos frecuentes. Esto puede incluir: Verificar cmo y dnde el beb est ubicado en el tero. Verificar el cuello uterino para determinar: Si se est afinando o estirando (borrando). Si se est abriendo (dilatando). Qu sucede durante el trabajo de parto y el parto?  El trabajo de parto y el parto normales se dividen en tres etapas: Etapa 1 Esta es la etapa ms larga del trabajo de parto. Durante esta etapa, sentir contracciones. En general, las contracciones son leves, infrecuentes e irregulares al principio. Se hacen ms fuertes, ms frecuentes y ms regulares a medida que avanza en esta etapa. Puede tener contracciones cada 2 o 3 minutos. Esta etapa finaliza cuando el cuello uterino est completamente   dilatado hasta 4 pulgadas (10 cm) y completamente borrado. Etapa 2 Esta etapa comienza una  vez que el cuello uterino est totalmente borrado y dilatado, y dura hasta el nacimiento del beb. Esta es la etapa en la que va a sentir ganas de pujar al beb fuera de la vagina. Puede sentir un dolor urente y por estiramiento, especialmente cuando la parte ms ancha de la cabeza del beb pasa a travs de la abertura vaginal (coronacin). Una vez que el beb nace, el cordn umbilical se pinzar y se cortar. El momento de cortar el cordn depender de sus deseos, de la salud del beb y de los mtodos del mdico. Colocarn al beb sobre su pecho desnudo (contacto piel con piel) en una posicin erguida y cubierto con una manta abrigada. Si optar por amamantar, observe al beb para detectar seales de hambre, como el reflejo de bsqueda o succin, y acrquelo al pecho para su primera alimentacin. Etapa 3 Esta etapa comienza inmediatamente despus del nacimiento del beb y finaliza despus de la expulsin de la placenta. Esta etapa puede durar de 5 a 30 minutos. Despus del nacimiento del beb, puede sentir contracciones cuando el cuerpo expulsa la placenta. Estas contracciones tambin ayudan a que el tero reduzca su tamao y a disminuir el sangrado. Qu puedo esperar despus del trabajo de parto y el parto? Una vez que termina el trabajo de parto, se los evaluar a usted y al beb atentamente hasta que estn listos para ir a casa. Su equipo de atencin mdica le ensear cmo cuidarse y cuidar a su beb. Le recomendarn que usted y el beb permanezcan en la misma sala (cohabitacin) durante su estada en el hospital. Esto ayudar a fomentar un vnculo temprano y una lactancia exitosa. Se le controlar y masajear el tero con regularidad (masaje fndico). Puede seguir recibiendo lquidos o medicamentos por va intravenosa. Tendr algo de inflamacin y dolor en el abdomen, la vagina y la zona de la piel entre la abertura vaginal y el ano (perineo). Si se le realiz una incisin cerca de la vagina  (episiotoma) o si ha tenido algn desgarro vaginal durante el parto, podran indicarle que se coloque compresas fras sobre la episiotoma o el desgarro. Esto ayuda a aliviar el dolor y la hinchazn. Es normal tener hemorragia vaginal despus del parto. Use un apsito sanitario para el sangrado vaginal y secrecin. Resumen Parto vaginal significa que usted dar a luz empujando al beb fuera del canal del parto (vagina). Su equipo de atencin mdica lo controlar a usted y al beb durante las etapas del parto. Despus del parto, su equipo de atencin mdica continuar evalundolos a usted y al beb para asegurarse de que ambos se estn recuperando segn lo esperado despus del parto. Esta informacin no tiene como fin reemplazar el consejo del mdico. Asegrese de hacerle al mdico cualquier pregunta que tenga. Document Revised: 01/30/2020 Document Reviewed: 01/30/2020 Elsevier Patient Education  2023 Elsevier Inc.  

## 2021-06-27 NOTE — Progress Notes (Signed)
Subjective:  ?Desirie Minteer is a 31 y.o. [phone] at [redacted]w[redacted]d being seen today for ongoing prenatal care.  She is currently monitored for the following issues for this low-risk pregnancy and has History of multiple miscarriages; Encounter for supervision of normal pregnancy, antepartum; and Language barrier on their problem list. ? ?Patient reports general discomforts of pregnancy.  Contractions: Not present. Vag. Bleeding: None.  Movement: Present. Denies leaking of fluid.  ? ?The following portions of the patient's history were reviewed and updated as appropriate: allergies, current medications, past family history, past medical history, past social history, past surgical history and problem list. Problem list updated. ? ?Objective:  ? ?Vitals:  ? 06/27/21 1130  ?BP: 107/64  ?Pulse: 90  ?Weight: 162 lb 6.4 oz (73.7 kg)  ? ? ?Fetal Status:     Movement: Present    ? ?General:  Alert, oriented and cooperative. Patient is in no acute distress.  ?Skin: Skin is warm and dry. No rash noted.   ?Cardiovascular: Normal heart rate noted  ?Respiratory: Normal respiratory effort, no problems with respiration noted  ?Abdomen: Soft, gravid, appropriate for gestational age. Pain/Pressure: Present     ?Pelvic:  Cervical exam performed        ?Extremities: Normal range of motion.  Edema: None  ?Mental Status: Normal mood and affect. Normal behavior. Normal judgment and thought content.  ? ?Urinalysis:     ? ?Assessment and Plan:  ?Pregnancy: [id number] at [redacted]w[redacted]d ? ?1. Supervision of other normal pregnancy, antepartum ?Labor precautions ?Growth scan today, 50 % ?- Cervicovaginal ancillary only( [place]) ?- Strep Gp B NAA ? ?2. [redacted] weeks gestation of pregnancy ? ?- Cervicovaginal ancillary only( [place]) ?- Strep Gp B NAA ? ?3. Language barrier ?Phone interrupter used during today's visit ? ?Term labor symptoms and general obstetric precautions including but not limited to vaginal bleeding, contractions, leaking of fluid  and fetal movement were reviewed in detail with the patient. ?Please refer to After Visit Summary for other counseling recommendations.  ?Return in about 1 week (around 07/04/2021) for OB visit, face to face, any provider. ? ? ?Hermina Staggers, MD ?

## 2021-06-29 LAB — STREP GP B NAA: Strep Gp B NAA: NEGATIVE

## 2021-07-01 LAB — CERVICOVAGINAL ANCILLARY ONLY
Chlamydia: NEGATIVE
Comment: NEGATIVE
Comment: NORMAL
Neisseria Gonorrhea: NEGATIVE

## 2021-07-02 ENCOUNTER — Ambulatory Visit (INDEPENDENT_AMBULATORY_CARE_PROVIDER_SITE_OTHER): Payer: Medicaid Other | Admitting: Medical

## 2021-07-02 ENCOUNTER — Encounter: Payer: Self-pay | Admitting: Medical

## 2021-07-02 VITALS — BP 103/63 | HR 86 | Wt 163.0 lb

## 2021-07-02 DIAGNOSIS — Z3A36 36 weeks gestation of pregnancy: Secondary | ICD-10-CM

## 2021-07-02 DIAGNOSIS — Z3480 Encounter for supervision of other normal pregnancy, unspecified trimester: Secondary | ICD-10-CM

## 2021-07-02 DIAGNOSIS — Z789 Other specified health status: Secondary | ICD-10-CM

## 2021-07-02 NOTE — Progress Notes (Signed)
? ?  PRENATAL VISIT NOTE ? ?Subjective:  ?Daisy Ford is a 31 y.o. [phone] at [redacted]w[redacted]d being seen today for ongoing prenatal care.  She is currently monitored for the following issues for this low-risk pregnancy and has History of multiple miscarriages; Encounter for supervision of normal pregnancy, antepartum; and Language barrier on their problem list. ? ?Patient reports occasional contractions, occasional pressure.  Contractions: Not present. Vag. Bleeding: None.  Movement: Present. Denies leaking of fluid.  ? ?The following portions of the patient's history were reviewed and updated as appropriate: allergies, current medications, past family history, past medical history, past social history, past surgical history and problem list.  ? ?Objective:  ? ?Vitals:  ? 07/02/21 1206  ?BP: 103/63  ?Pulse: 86  ?Weight: 163 lb (73.9 kg)  ? ? ?Fetal Status: Fetal Heart Rate (bpm): 148 Fundal Height: 36 cm Movement: Present    ? ?General:  Alert, oriented and cooperative. Patient is in no acute distress.  ?Skin: Skin is warm and dry. No rash noted.   ?Cardiovascular: Normal heart rate noted  ?Respiratory: Normal respiratory effort, no problems with respiration noted  ?Abdomen: Soft, gravid, appropriate for gestational age.  Pain/Pressure: Absent     ?Pelvic: Cervical exam deferred        ?Extremities: Normal range of motion.  Edema: Trace  ?Mental Status: Normal mood and affect. Normal behavior. Normal judgment and thought content.  ? ?Assessment and Plan:  ?Pregnancy: [id number] at [redacted]w[redacted]d ?1. Supervision of other normal pregnancy, antepartum ?- Doing well ?- Negative cultures from last visit discussed ? ?2. Language barrier ?- Interpreter present  ? ?3. [redacted] weeks gestation of pregnancy ? ?Preterm labor symptoms and general obstetric precautions including but not limited to vaginal bleeding, contractions, leaking of fluid and fetal movement were reviewed in detail with the patient. ?Please refer to After Visit Summary for  other counseling recommendations.  ? ?No follow-ups on file. ? ?Future Appointments  ?Date Time Provider Department Center  ?07/10/2021  2:30 PM Myna Hidalgo, DO [facility]  ?07/17/2021  1:50 PM Myna Hidalgo, DO [facility]  ?07/24/2021  1:50 PM Myna Hidalgo, DO [facility]  ? ? ?Vonzella Nipple, PA-C ? ?

## 2021-07-03 ENCOUNTER — Encounter: Payer: Medicaid Other | Admitting: Women's Health

## 2021-07-10 ENCOUNTER — Encounter: Payer: Self-pay | Admitting: Obstetrics & Gynecology

## 2021-07-10 ENCOUNTER — Ambulatory Visit (INDEPENDENT_AMBULATORY_CARE_PROVIDER_SITE_OTHER): Payer: Medicaid Other | Admitting: Obstetrics & Gynecology

## 2021-07-10 VITALS — BP 111/62 | HR 91 | Wt 165.0 lb

## 2021-07-10 DIAGNOSIS — Z3403 Encounter for supervision of normal first pregnancy, third trimester: Secondary | ICD-10-CM | POA: Diagnosis not present

## 2021-07-10 DIAGNOSIS — O26843 Uterine size-date discrepancy, third trimester: Secondary | ICD-10-CM

## 2021-07-10 DIAGNOSIS — N898 Other specified noninflammatory disorders of vagina: Secondary | ICD-10-CM

## 2021-07-10 LAB — POCT WET PREP (WET MOUNT): Trichomonas Wet Prep HPF POC: ABSENT

## 2021-07-10 NOTE — Progress Notes (Signed)
? ?  LOW-RISK PREGNANCY VISIT ?Patient name: Daisy Ford MRN [id number]  Date of birth: [birth year] ?Chief Complaint:   ?Routine Prenatal Visit ? ?History of Present Illness:   ?Daisy Ford is a 31 y.o. [phone] female at [redacted]w[redacted]d with an Estimated Date of Delivery: 07/24/21 being seen today for ongoing management of a low-risk pregnancy.  ? ?Spanish interpreter used over the phone for today's visit ? ? ?  01/15/2021  ?  2:57 PM 07/03/2019  ?  2:35 PM  ?Depression screen PHQ 2/9  ?Decreased Interest 0 0  ?Down, Depressed, Hopeless 0 0  ?PHQ - 2 Score 0 0  ?Altered sleeping 1 0  ?Tired, decreased energy 1 1  ?Change in appetite 0 0  ?Feeling bad or failure about yourself  0 0  ?Trouble concentrating 0 0  ?Moving slowly or fidgety/restless 0 0  ?Suicidal thoughts 0 0  ?PHQ-9 Score 2 1  ? ? ?Today she reports occasional contractions. Contractions: Irritability. Vag. Bleeding: None.  Movement: Present. denies leaking of fluid. ?Review of Systems:   ?Pertinent items are noted in HPI ?She has noted increased discharge with slight itching.  Denies headaches, visual changes, shortness of breath, chest pain, abdominal pain, severe nausea/vomiting, or problems with urination or bowel movements unless otherwise stated above. ?Pertinent History Reviewed:  ?Reviewed past medical,surgical, social, obstetrical and family history.  ?Reviewed problem list, medications and allergies. ? ?Physical Assessment:  ? ?Vitals:  ? 07/10/21 1454  ?BP: 111/62  ?Pulse: 91  ?Weight: 165 lb (74.8 kg)  ?Body mass index is 27.46 kg/m?. ?  ?     Physical Examination:  ? General appearance: Well appearing, and in no distress ? Mental status: Alert, oriented to person, place, and time ? Skin: Warm & dry ? Respiratory: Normal respiratory effort, no distress ? Abdomen: Soft, gravid, nontender ? Pelvic: Cervical exam performed  Dilation: 1.5 Effacement (%): 20 Station: -3 ?Normal external genitalia, Minimal discharge noted, pink vaginal  mucosa ? Extremities: Edema: None ? Psych:  mood and affect appropriate ? ?Fetal Status: Fetal Heart Rate (bpm): 140 Fundal Height: 36 cm Movement: Present Presentation: Vertex ? ?Chaperone:  Glenard Haring [place]    ? ?Bedside US: vertex, FHT 140bpm.  Subjectively normal fluid appreciated, 4 pockets appreciated, SDP: 3cm ? ?Assessment & Plan:  ?1) Low-risk pregnancy [id number] at [redacted]w[redacted]d with an Estimated Date of Delivery: 07/24/21  ? ?-suspect uterine size discrepancy due to fetal position low in the pelvis.  Normal fluid volume noted ? ?-Vaginal discharge: wet mount negative ?  ?Meds: No orders of the defined types were placed in this encounter. ? ?Labs/procedures today: bedside US, wet mount ? ?Plan:  Continue routine obstetrical care  ?Next visit: prefers in person   ? ?Reviewed: Term labor symptoms and general obstetric precautions including but not limited to vaginal bleeding, contractions, leaking of fluid and fetal movement were reviewed in detail with the patient.  All questions were answered.  ? ?Follow-up: Return in about 1 week (around 07/17/2021) for [place] visit. ? ?Orders Placed This Encounter  ?Procedures  ? POCT Wet Prep [facility])  ? ? ?Janyth Pupa, DO ?Attending [place], Faculty Practice ?Center for [facility] ? ? ? ?

## 2021-07-14 ENCOUNTER — Encounter: Payer: Self-pay | Admitting: Obstetrics & Gynecology

## 2021-07-14 ENCOUNTER — Ambulatory Visit (INDEPENDENT_AMBULATORY_CARE_PROVIDER_SITE_OTHER): Payer: Medicaid Other | Admitting: *Deleted

## 2021-07-14 ENCOUNTER — Encounter: Payer: Self-pay | Admitting: *Deleted

## 2021-07-14 VITALS — BP 107/64 | HR 101 | Wt 166.0 lb

## 2021-07-14 DIAGNOSIS — Z3480 Encounter for supervision of other normal pregnancy, unspecified trimester: Secondary | ICD-10-CM | POA: Diagnosis not present

## 2021-07-14 DIAGNOSIS — Z0130 Encounter for examination of blood pressure without abnormal findings: Secondary | ICD-10-CM | POA: Diagnosis not present

## 2021-07-14 NOTE — Progress Notes (Signed)
? ?  NURSE VISIT- BLOOD PRESSURE CHECK ? ?SUBJECTIVE:  ?Daisy Ford is a 31 y.o. [phone] female here for BP check and decreased fetal movement for 2 days. She is [redacted]w[redacted]d pregnant.  ? ?HYPERTENSION ROS: ? ?Pregnant:  ?Severe headaches that don't go away with tylenol/other medicines: No  ?Visual changes (seeing spots/double/blurred vision) No  ?Severe pain under right breast breast or in center of upper chest No  ?Severe nausea/vomiting No  ?Taking medicines as instructed not applicable ? ?OBJECTIVE:  ?BP 107/64   Pulse (!) 101   Wt 166 lb (75.3 kg)   LMP 10/17/2020 (Exact Date)   BMI 27.62 kg/m?   ?Appearance alert, well appearing, and in no distress and oriented to person, place, and time. ? ?ASSESSMENT: ?Pregnancy [redacted]w[redacted]d  blood pressure check ?FHR baseline 140, Moderate variability, Accels present, decels absent, CAT 1, UC-none ? ?PLAN: ?Discussed with Daisy Ford, CNM, [place]   ?Recommendations: no changes needed   ?Follow-up: as scheduled  ? ?Kristeen Miss Cresenzo  ?07/14/2021 ?5:19 PM ? ?

## 2021-07-17 ENCOUNTER — Ambulatory Visit (INDEPENDENT_AMBULATORY_CARE_PROVIDER_SITE_OTHER): Payer: Medicaid Other | Admitting: Obstetrics & Gynecology

## 2021-07-17 ENCOUNTER — Encounter: Payer: Self-pay | Admitting: Obstetrics & Gynecology

## 2021-07-17 VITALS — BP 110/67 | HR 88 | Wt 166.8 lb

## 2021-07-17 DIAGNOSIS — Z3480 Encounter for supervision of other normal pregnancy, unspecified trimester: Secondary | ICD-10-CM

## 2021-07-17 NOTE — Progress Notes (Signed)
? ?  LOW-RISK PREGNANCY VISIT ?Patient name: Daisy Ford MRN [id number]  Date of birth: August 28, 1990 ?Chief Complaint:   ?Routine Prenatal Visit ? ?History of Present Illness:   ?Daisy Ford is a 31 y.o. [phone] female at [redacted]w[redacted]d with an Estimated Date of Delivery: 07/24/21 being seen today for ongoing management of a low-risk pregnancy.  ? ?Spanish interpreter present ? ? ?  01/15/2021  ?  2:57 PM 07/03/2019  ?  2:35 PM  ?Depression screen PHQ 2/9  ?Decreased Interest 0 0  ?Down, Depressed, Hopeless 0 0  ?PHQ - 2 Score 0 0  ?Altered sleeping 1 0  ?Tired, decreased energy 1 1  ?Change in appetite 0 0  ?Feeling bad or failure about yourself  0 0  ?Trouble concentrating 0 0  ?Moving slowly or fidgety/restless 0 0  ?Suicidal thoughts 0 0  ?PHQ-9 Score 2 1  ? ? ?Today she reports no complaints. Contractions: Irritability. Vag. Bleeding: None.  Movement: Present. denies leaking of fluid. ?Review of Systems:   ?Pertinent items are noted in HPI ?Denies abnormal vaginal discharge w/ itching/odor/irritation, headaches, visual changes, shortness of breath, chest pain, abdominal pain, severe nausea/vomiting, or problems with urination or bowel movements unless otherwise stated above. ?Pertinent History Reviewed:  ?Reviewed past medical,surgical, social, obstetrical and family history.  ?Reviewed problem list, medications and allergies. ? ?Physical Assessment:  ? ?Vitals:  ? 07/17/21 1406  ?BP: 110/67  ?Pulse: 88  ?Weight: 166 lb 12.8 oz (75.7 kg)  ?Body mass index is 27.76 kg/m?. ?  ?     Physical Examination:  ? General appearance: Well appearing, and in no distress ? Mental status: Alert, oriented to person, place, and time ? Skin: Warm & dry ? Respiratory: Normal respiratory effort, no distress ? Abdomen: Soft, gravid, nontender ? Pelvic: Cervical exam performed  Dilation: 1.5 Effacement (%): 20 Station: -3 ? Extremities: Edema: Trace ? Psych:  mood and affect appropriate ? ?Fetal Status: Fetal Heart Rate  (bpm): 140 Fundal Height: 35 cm Movement: Present Presentation: Vertex ? ?Chaperone:  pt declined    ? ?No results found for this or any previous visit (from the past 24 hour(s)).  ? ?Assessment & Plan:  ?1) Low-risk pregnancy [id number] at [redacted]w[redacted]d with an Estimated Date of Delivery: 07/24/21  ? ?-Small for dates- [place] confirmed AGA and fetal head low in pelvis ?[place] 4/21 @ [redacted]w[redacted]d- EFW 2848g (50%) ?-discussed IOL @ 40+ wks if still pregnant ?-follow up next week ?  ?Meds: No orders of the defined types were placed in this encounter. ? ?Labs/procedures today: none ? ?Plan:  Continue routine obstetrical care  ?Next visit: prefers in person   ? ?Reviewed: Term labor symptoms and general obstetric precautions including but not limited to vaginal bleeding, contractions, leaking of fluid and fetal movement were reviewed in detail with the patient.  All questions were answered.  ? ?Follow-up: Return in about 1 week (around 07/24/2021) for LROB visit. ? ?No orders of the defined types were placed in this encounter. ? ? ?Daisy Hidalgo, DO ?Attending Obstetrician & Gynecologist, Faculty Practice ?Center for [employer], [facility] ? ? ? ?

## 2021-07-18 ENCOUNTER — Telehealth: Payer: Self-pay | Admitting: Obstetrics & Gynecology

## 2021-07-18 NOTE — Telephone Encounter (Signed)
Patient called stating that she was speaking with the provider yesterday and was told she could pick a date for delivery and the patient stated that she would wait until the baby was born, but she has discussed this with her husband and she would like to know if it is still okay to pick the date. Please contact pt ?

## 2021-07-21 ENCOUNTER — Encounter (HOSPITAL_COMMUNITY): Payer: Self-pay | Admitting: Obstetrics and Gynecology

## 2021-07-21 ENCOUNTER — Inpatient Hospital Stay (HOSPITAL_COMMUNITY)
Admission: AD | Admit: 2021-07-21 | Discharge: 2021-07-21 | Disposition: A | Discharge status: Home / Self Care | Payer: Medicaid Other | Attending: Obstetrics and Gynecology | Admitting: Obstetrics and Gynecology

## 2021-07-21 DIAGNOSIS — Z3A39 39 weeks gestation of pregnancy: Secondary | ICD-10-CM | POA: Diagnosis not present

## 2021-07-21 DIAGNOSIS — O26893 Other specified pregnancy related conditions, third trimester: Secondary | ICD-10-CM

## 2021-07-21 DIAGNOSIS — R002 Palpitations: Secondary | ICD-10-CM | POA: Insufficient documentation

## 2021-07-21 DIAGNOSIS — R519 Headache, unspecified: Secondary | ICD-10-CM | POA: Insufficient documentation

## 2021-07-21 DIAGNOSIS — R42 Dizziness and giddiness: Secondary | ICD-10-CM | POA: Insufficient documentation

## 2021-07-21 DIAGNOSIS — Z3689 Encounter for other specified antenatal screening: Secondary | ICD-10-CM

## 2021-07-21 LAB — CBC
HCT: 34.8 % — ABNORMAL LOW (ref 36.0–46.0)
Hemoglobin: 12 g/dL (ref 12.0–15.0)
MCH: 32.7 pg (ref 26.0–34.0)
MCHC: 34.5 g/dL (ref 30.0–36.0)
MCV: 94.8 fL (ref 80.0–100.0)
Platelets: 250 10*3/uL (ref 150–400)
RBC: 3.67 MIL/uL — ABNORMAL LOW (ref 3.87–5.11)
RDW: 12.7 % (ref 11.5–15.5)
WBC: 9.1 10*3/uL (ref 4.0–10.5)
nRBC: 0 % (ref 0.0–0.2)

## 2021-07-21 LAB — URINALYSIS, ROUTINE W REFLEX MICROSCOPIC
Bilirubin Urine: NEGATIVE
Glucose, UA: NEGATIVE mg/dL
Hgb urine dipstick: NEGATIVE
Ketones, ur: NEGATIVE mg/dL
Nitrite: NEGATIVE
Protein, ur: NEGATIVE mg/dL
Specific Gravity, Urine: 1.014 (ref 1.005–1.030)
pH: 6 (ref 5.0–8.0)

## 2021-07-21 LAB — COMPREHENSIVE METABOLIC PANEL
ALT: 18 U/L (ref 0–44)
AST: 21 U/L (ref 15–41)
Albumin: 2.9 g/dL — ABNORMAL LOW (ref 3.5–5.0)
Alkaline Phosphatase: 111 U/L (ref 38–126)
Anion gap: 7 (ref 5–15)
BUN: 13 mg/dL (ref 6–20)
CO2: 22 mmol/L (ref 22–32)
Calcium: 9 mg/dL (ref 8.9–10.3)
Chloride: 106 mmol/L (ref 98–111)
Creatinine, Ser: 0.61 mg/dL (ref 0.44–1.00)
GFR, Estimated: >60 mL/min (ref >60)
Glucose, Bld: 86 mg/dL (ref 70–99)
Potassium: 3.5 mmol/L (ref 3.5–5.1)
Sodium: 135 mmol/L (ref 135–145)
Total Bilirubin: 0.4 mg/dL (ref 0.3–1.2)
Total Protein: 6.2 g/dL — ABNORMAL LOW (ref 6.5–8.1)

## 2021-07-21 LAB — MAGNESIUM: Magnesium: 1.7 mg/dL (ref 1.7–2.4)

## 2021-07-21 NOTE — MAU Note (Signed)
Felesia Stahlecker is a 31 y.o. at [redacted]w[redacted]d here in MAU reporting: about 3 hrs ago, started feeling numbness in her rt arm- including hand. Then she started to feel very hot, her vision started bleeding and she got a HA.  The past 2 days has noted swelling in her feet and tingling in the soles. No other pain, denies vag bleeding or LOF. Has had mucous d/c.  Reports +FM. ?Onset of complaint: 2 days ago ?Pain score: HA: 3 ?Vitals:  ? 07/21/21 1853  ?BP: 119/67  ?Pulse: 92  ?Resp: 17  ?Temp: 98.9 ?F (37.2 ?C)  ?SpO2: 99%  ?   ?FHT:166 ?Lab orders placed from triage:  urine ? ?FYI, lately when she has had Deberah Pelton, she feels her heart racing.  Denies holding her breath- tries to remain calm and take slow deep breaths.  ?

## 2021-07-21 NOTE — MAU Provider Note (Addendum)
?History  ?  ? ?CSN: [id number] ? ?Arrival date and time: 07/21/21 1816 ? ?Chief Complaint  ?Patient presents with  ? Headache  ? Dizziness  ? Nausea  ? Foot Swelling  ? Numbness  ? ?HPI ?This is a 31 year old G4 P1-0-2-1 at 39 weeks 4 days who presents with an episode of dizziness and palpitations that happened earlier today.  Patient sat down for about an hour and the symptoms gradually resolved.  She did have some numbness and tingling in her right arm, but this is resolved.  She does occasionally have symptoms of palpitations when she has contractions.  She usually does not develop dizziness.  Her appetite has been normal and she has been eating and drinking without problems.  Denies chest pain, shortness of breath, headache, fevers, chills ? ?OB History   ? ? Gravida  ?4  ? Para  ?1  ? Term  ?1  ? Preterm  ?   ? AB  ?2  ? Living  ?1  ?  ? ? SAB  ?2  ? IAB  ?   ? Ectopic  ?   ? Multiple  ?0  ? Live Births  ?1  ?   ?  ?  ? ?Past Medical History:  ?Diagnosis Date  ? Medical history non-contributory   ? Miscarriage   ? ?Past Surgical History:  ?Procedure Laterality Date  ? APPENDECTOMY    ? ?Family History  ?Problem Relation Age of Onset  ? Cancer Paternal Grandfather   ?     prostate  ? ?Social History  ? ?Tobacco Use  ? Smoking status: Never  ? Smokeless tobacco: Never  ?Vaping Use  ? Vaping Use: Never used  ?Substance Use Topics  ? Alcohol use: No  ? Drug use: No  ? ?Allergies:  ?Allergies  ?Allergen Reactions  ? Caffeine Palpitations  ? ?Medications Prior to Admission  ?Medication Sig Dispense Refill Last Dose  ? pantoprazole (PROTONIX) 20 MG tablet Take 1 tablet (20 mg total) by mouth daily. 30 tablet 2 07/21/2021  ? Prenatal Vit-Fe Fumarate-FA (MULTIVITAMIN-PRENATAL) 27-0.8 MG TABS tablet Take 1 tablet by mouth daily at 12 noon.   07/21/2021  ? Blood Pressure Monitor MISC For regular home bp monitoring during pregnancy (Patient not taking: Reported on 04/02/2021) 1 each 0   ? cyclobenzaprine (FLEXERIL) 10 MG  tablet Take 1 tablet (10 mg total) by mouth 2 (two) times daily as needed for muscle spasms. (Patient not taking: Reported on 07/14/2021) 20 tablet 0   ? ?Review of Systems ?Pertinent items noted in HPI and remainder of comprehensive ROS otherwise negative.  ?Physical Exam  ? ?Blood pressure 119/67, pulse 92, temperature 98.9 ?F (37.2 ?C), temperature source Oral, resp. rate 17, height 5\' 5"  (1.651 m), weight 75.3 kg, last menstrual period 10/17/2020, SpO2 99 %. ? ?Physical Exam ?Vitals and nursing note reviewed.  ?Constitutional:   ?   Appearance: She is well-developed.  ?HENT:  ?   Head: Normocephalic and atraumatic.  ?Cardiovascular:  ?   Rate and Rhythm: Normal rate and regular rhythm.  ?   Heart sounds: Normal heart sounds.  ?Pulmonary:  ?   Effort: Pulmonary effort is normal. No respiratory distress.  ?   Breath sounds: Normal breath sounds. No wheezing.  ?Abdominal:  ?   General: Bowel sounds are normal. There is no distension.  ?   Palpations: Abdomen is soft.  ?Skin: ?   General: Skin is warm and dry.  ?Neurological:  ?  Mental Status: She is alert.  ?Psychiatric:     ?   Mood and Affect: Mood normal.     ?   Speech: Speech normal.     ?   Behavior: Behavior normal.  ? ?MAU Course  ?Procedures ?NST:  ?Baseline: 140  ?Variability: moderate ?Accelerations: ++  ?Decelerations: neg ?Contractions: every 8-9 minutes ? ?MDM ?Check EKG, CMP, CBC, orthostatics.  ?Care turned over to Edd Arbour, CNM ?Levie Heritage, DO ?07/21/2021 ?7:59 PM ? ?EKG, orthostatics and lab work normal with no further physical symptoms.  ? ?Assessment and Plan  ?Headache in pregnancy ?Dizziness in pregnancy ? ?Discharge home in stable condition with return precautions ?Follow up at [facility] as scheduled for ongoing prenatal care ? ?Edd Arbour, CNM, MSN, IBCLC ?Certified Nurse Midwife, [facility] ? ?  ? ?

## 2021-07-24 ENCOUNTER — Telehealth (HOSPITAL_COMMUNITY): Payer: Self-pay | Admitting: *Deleted

## 2021-07-24 ENCOUNTER — Encounter: Payer: Self-pay | Admitting: Advanced Practice Midwife

## 2021-07-24 ENCOUNTER — Encounter (HOSPITAL_COMMUNITY): Payer: Self-pay | Admitting: *Deleted

## 2021-07-24 ENCOUNTER — Ambulatory Visit (INDEPENDENT_AMBULATORY_CARE_PROVIDER_SITE_OTHER): Payer: Medicaid Other | Admitting: Advanced Practice Midwife

## 2021-07-24 VITALS — BP 113/68 | HR 89 | Wt 167.0 lb

## 2021-07-24 DIAGNOSIS — Z3483 Encounter for supervision of other normal pregnancy, third trimester: Secondary | ICD-10-CM

## 2021-07-24 DIAGNOSIS — Z3A40 40 weeks gestation of pregnancy: Secondary | ICD-10-CM

## 2021-07-24 NOTE — Telephone Encounter (Signed)
Preadmission screen  

## 2021-07-24 NOTE — Progress Notes (Signed)
   LOW-RISK PREGNANCY VISIT Patient name: Daisy Ford MRN [id number]  Date of birth: 1991-02-10 Chief Complaint:   Routine Prenatal Visit  History of Present Illness:   Daisy Ford is a 31 y.o. [phone] female at [redacted]w[redacted]d with an Estimated Date of Delivery: 07/24/21 being seen today for ongoing management of a low-risk pregnancy.  Today she reports no complaints. Contractions: Irregular. Vag. Bleeding: None.  Movement: Present. denies leaking of fluid. Review of Systems:   Pertinent items are noted in HPI Denies abnormal vaginal discharge w/ itching/odor/irritation, headaches, visual changes, shortness of breath, chest pain, abdominal pain, severe nausea/vomiting, or problems with urination or bowel movements unless otherwise stated above. Pertinent History Reviewed:  Reviewed past medical,surgical, social, obstetrical and family history.  Reviewed problem list, medications and allergies. Physical Assessment:   Vitals:   07/24/21 1401  BP: 113/68  Pulse: 89  Weight: 167 lb (75.8 kg)  Body mass index is 27.79 kg/m.        Physical Examination:   General appearance: Well appearing, and in no distress  Mental status: Alert, oriented to person, place, and time  Skin: Warm & dry  Cardiovascular: Normal heart rate noted  Respiratory: Normal respiratory effort, no distress  Abdomen: Soft, gravid, nontender  Pelvic: Cervical exam performed  Dilation: 1.5 Effacement (%): 50 Station: -2  Extremities: Edema: Trace  Fetal Status: Fetal Heart Rate (bpm): 135   Movement: Present Presentation: VertexNST: FHR baseline 135 bpm, Variability: moderate, Accelerations:present, Decelerations:  Absent= Cat 1/Reactive   Chaperone: Latisha Cresenzo    No results found for this or any previous visit (from the past 24 hour(s)).  Assessment & Plan:  1) Low-risk pregnancy [id number] at [redacted]w[redacted]d with an Estimated Date of Delivery: 07/24/21   2) IOL set for 5/25 am w/outpt foley on 5/24,  orders in EPIC   Meds: No orders of the defined types were placed in this encounter.  Labs/procedures today: NST  Plan:  Continue routine obstetrical care  Next visit: prefers in person    Reviewed: Term labor symptoms and general obstetric precautions including but not limited to vaginal bleeding, contractions, leaking of fluid and fetal movement were reviewed in detail with the patient.  All questions were answered. Has home bp cuff. Rx faxed to . Check bp weekly, let us know if >140/90.   Follow-up: Return for Monday for NST w/RN and 5/24 for outpt foley placement/NST.  No orders of the defined types were placed in this encounter.  Jacklyn Shell DNP, CNM 07/24/2021 2:25 PM

## 2021-07-28 ENCOUNTER — Ambulatory Visit (INDEPENDENT_AMBULATORY_CARE_PROVIDER_SITE_OTHER): Payer: Medicaid Other | Admitting: *Deleted

## 2021-07-28 DIAGNOSIS — Z3480 Encounter for supervision of other normal pregnancy, unspecified trimester: Secondary | ICD-10-CM

## 2021-07-28 DIAGNOSIS — Z3A40 40 weeks gestation of pregnancy: Secondary | ICD-10-CM

## 2021-07-28 NOTE — Progress Notes (Signed)
   NURSE VISIT- NST  SUBJECTIVE:  Daisy Ford is a 31 y.o. [phone] female at [redacted]w[redacted]d, here for a NST for pregnancy Post dates.  She reports active fetal movement, contractions: occasional, vaginal bleeding: none, membranes: intact.   OBJECTIVE:  BP 103/61   Pulse 87   Wt 167 lb (75.8 kg)   LMP 10/17/2020 (Exact Date)   BMI 27.79 kg/m   Appears well, no apparent distress  No results found for this or any previous visit (from the past 24 hour(s)).  NST: FHR baseline 145 bpm, Variability: moderate, Accelerations:present, Decelerations:  Absent= Cat 1/reactive Toco: occasional   ASSESSMENT: [id number] at [redacted]w[redacted]d with Post dates NST reactive  PLAN: EFM strip reviewed by Dr. Charlotta Newton   Recommendations: keep next appointment as scheduled    Jobe Marker  07/28/2021 12:16 PM

## 2021-07-29 ENCOUNTER — Inpatient Hospital Stay (EMERGENCY_DEPARTMENT_HOSPITAL)
Admission: AD | Admit: 2021-07-29 | Discharge: 2021-07-29 | Disposition: A | Discharge status: Home / Self Care | Payer: Medicaid Other | Source: Home / Self Care | Attending: Obstetrics and Gynecology | Admitting: Obstetrics and Gynecology

## 2021-07-29 ENCOUNTER — Encounter (HOSPITAL_COMMUNITY): Payer: Self-pay | Admitting: Obstetrics and Gynecology

## 2021-07-29 ENCOUNTER — Other Ambulatory Visit: Payer: Self-pay | Admitting: Women's Health

## 2021-07-29 ENCOUNTER — Other Ambulatory Visit: Payer: Self-pay

## 2021-07-29 ENCOUNTER — Inpatient Hospital Stay (HOSPITAL_COMMUNITY)
Admission: AD | Admit: 2021-07-29 | Discharge: 2021-07-31 | DRG: 807 | Disposition: A | Discharge status: Home / Self Care | Payer: Medicaid Other | Attending: Obstetrics and Gynecology | Admitting: Obstetrics and Gynecology

## 2021-07-29 DIAGNOSIS — Z3A40 40 weeks gestation of pregnancy: Secondary | ICD-10-CM

## 2021-07-29 DIAGNOSIS — K219 Gastro-esophageal reflux disease without esophagitis: Secondary | ICD-10-CM | POA: Diagnosis present

## 2021-07-29 DIAGNOSIS — O9962 Diseases of the digestive system complicating childbirth: Secondary | ICD-10-CM | POA: Diagnosis present

## 2021-07-29 DIAGNOSIS — Z30430 Encounter for insertion of intrauterine contraceptive device: Secondary | ICD-10-CM

## 2021-07-29 DIAGNOSIS — Z789 Other specified health status: Secondary | ICD-10-CM | POA: Diagnosis present

## 2021-07-29 DIAGNOSIS — O471 False labor at or after 37 completed weeks of gestation: Secondary | ICD-10-CM | POA: Insufficient documentation

## 2021-07-29 DIAGNOSIS — Z3480 Encounter for supervision of other normal pregnancy, unspecified trimester: Secondary | ICD-10-CM

## 2021-07-29 DIAGNOSIS — O480 Post-term pregnancy: Principal | ICD-10-CM | POA: Diagnosis present

## 2021-07-29 DIAGNOSIS — O479 False labor, unspecified: Secondary | ICD-10-CM | POA: Diagnosis not present

## 2021-07-29 DIAGNOSIS — Z975 Presence of (intrauterine) contraceptive device: Secondary | ICD-10-CM

## 2021-07-29 DIAGNOSIS — N96 Recurrent pregnancy loss: Secondary | ICD-10-CM | POA: Diagnosis present

## 2021-07-29 DIAGNOSIS — Z3490 Encounter for supervision of normal pregnancy, unspecified, unspecified trimester: Secondary | ICD-10-CM

## 2021-07-29 DIAGNOSIS — Z309 Encounter for contraceptive management, unspecified: Secondary | ICD-10-CM

## 2021-07-29 HISTORY — DX: Gastro-esophageal reflux disease without esophagitis: K21.9

## 2021-07-29 NOTE — MAU Provider Note (Signed)
S: Patient is here for RN labor evaluation. Fetal tracing, vital signs, & chart reviewed   O:  Vitals:   07/29/21 0745 07/29/21 0755  BP: (!) 117/58 (!) 109/54  Pulse: 82 87  Resp: 16   Temp: 98.4 F (36.9 C)   TempSrc: Oral   SpO2: 100%   Weight: 74.6 kg   Height: 5\' 5"  (1.651 m)    No results found for this or any previous visit (from the past 24 hour(s)).  Dilation: 2 Effacement (%): 50 Cervical Position: Posterior Station: -2 Presentation: Vertex Exam by:: [email or web address], RN. Cervix unchanged on recheck  FHR: 130 bpm, Mod Var, no Decels, + Accels UC: every 4-5 mins   A: 1. Supervision of other normal pregnancy, antepartum   False labor   P:  RN to discharge home in stable condition with return precautions & fetal kick counts   Of note, patient has IOL scheduled for 5/25 with plan for outpatient FB placement on 5/24. Reports fetus is very active. No decrease in detal movement today.  Since cat I tracing and unchanged DC today. Return precautions discussed by RN team in detail especially if DFM especially since [redacted]w[redacted]d. Patient to go in for her FB placement in office tomorrow (already scheduled) and come in for IOL on 5/25  6/25, MD @T @ 10:16 AM

## 2021-07-29 NOTE — MAU Note (Signed)
...  Daisy Ford is a 31 y.o. at [redacted]w[redacted]d here in MAU reporting: CTX since 0300 this morning that are currently 3-5 minutes apart. Denies LOF but endorses bloody show. +FM.   GBS-. FHR arrhythmia.  Onset of complaint: this morning at 0300 Pain score: 4/10 lower abdomen  FHT: 135 initial external Lab orders placed from triage: MAU Labor

## 2021-07-30 ENCOUNTER — Ambulatory Visit: Payer: Medicaid Other

## 2021-07-30 ENCOUNTER — Encounter (HOSPITAL_COMMUNITY): Payer: Self-pay | Admitting: Obstetrics & Gynecology

## 2021-07-30 ENCOUNTER — Encounter: Payer: Medicaid Other | Admitting: Obstetrics & Gynecology

## 2021-07-30 ENCOUNTER — Inpatient Hospital Stay (HOSPITAL_COMMUNITY): Payer: Medicaid Other | Admitting: Anesthesiology

## 2021-07-30 DIAGNOSIS — Z309 Encounter for contraceptive management, unspecified: Secondary | ICD-10-CM

## 2021-07-30 DIAGNOSIS — Z30014 Encounter for initial prescription of intrauterine contraceptive device: Secondary | ICD-10-CM

## 2021-07-30 DIAGNOSIS — Z30430 Encounter for insertion of intrauterine contraceptive device: Secondary | ICD-10-CM | POA: Diagnosis not present

## 2021-07-30 DIAGNOSIS — Z3A40 40 weeks gestation of pregnancy: Secondary | ICD-10-CM

## 2021-07-30 DIAGNOSIS — K219 Gastro-esophageal reflux disease without esophagitis: Secondary | ICD-10-CM | POA: Diagnosis present

## 2021-07-30 DIAGNOSIS — O9962 Diseases of the digestive system complicating childbirth: Secondary | ICD-10-CM | POA: Diagnosis present

## 2021-07-30 DIAGNOSIS — O480 Post-term pregnancy: Secondary | ICD-10-CM

## 2021-07-30 LAB — RPR: RPR Ser Ql: NONREACTIVE

## 2021-07-30 LAB — CBC
HCT: 41.7 % (ref 36.0–46.0)
Hemoglobin: 14 g/dL (ref 12.0–15.0)
MCH: 32.6 pg (ref 26.0–34.0)
MCHC: 33.6 g/dL (ref 30.0–36.0)
MCV: 97.2 fL (ref 80.0–100.0)
Platelets: 267 10*3/uL (ref 150–400)
RBC: 4.29 MIL/uL (ref 3.87–5.11)
RDW: 12.8 % (ref 11.5–15.5)
WBC: 12.9 10*3/uL — ABNORMAL HIGH (ref 4.0–10.5)
nRBC: 0 % (ref 0.0–0.2)

## 2021-07-30 LAB — TYPE AND SCREEN
ABO/RH(D): O POS
Antibody Screen: NEGATIVE

## 2021-07-30 MED ORDER — FENTANYL-BUPIVACAINE-NACL 0.5-0.125-0.9 MG/250ML-% EP SOLN
12.0000 mL/h | EPIDURAL | Status: DC | PRN
  Administered 2021-07-30: 12 mL/h via EPIDURAL
  Filled 2021-07-30: qty 250

## 2021-07-30 MED ORDER — FENTANYL CITRATE (PF) 100 MCG/2ML IJ SOLN
50.0000 ug | INTRAMUSCULAR | Status: DC | PRN
  Administered 2021-07-30: 100 ug via INTRAVENOUS
  Filled 2021-07-30: qty 2

## 2021-07-30 MED ORDER — COCONUT OIL OIL: 1.0000 "application " | TOPICAL_OIL | Status: DC | PRN

## 2021-07-30 MED ORDER — IBUPROFEN 600 MG PO TABS
600.0000 mg | ORAL_TABLET | Freq: Four times a day (QID) | ORAL | Status: DC
  Administered 2021-07-30 – 2021-07-31 (×5): 600 mg via ORAL
  Filled 2021-07-30 (×6): qty 1

## 2021-07-30 MED ORDER — LACTATED RINGERS IV SOLN: 500.0000 mL | Freq: Once | INTRAVENOUS | Status: DC

## 2021-07-30 MED ORDER — OXYTOCIN-SODIUM CHLORIDE 30-0.9 UT/500ML-% IV SOLN
2.5000 [IU]/h | INTRAVENOUS | Status: DC
  Filled 2021-07-30: qty 500

## 2021-07-30 MED ORDER — WITCH HAZEL-GLYCERIN EX PADS
1.0000 | MEDICATED_PAD | CUTANEOUS | Status: DC | PRN
Start: 2021-07-30 — End: 2021-07-31

## 2021-07-30 MED ORDER — PHENYLEPHRINE 80 MCG/ML (10ML) SYRINGE FOR IV PUSH (FOR BLOOD PRESSURE SUPPORT): 80.0000 ug | PREFILLED_SYRINGE | INTRAVENOUS | Status: DC | PRN

## 2021-07-30 MED ORDER — LEVONORGESTREL 20 MCG/DAY IU IUD
1.0000 | INTRAUTERINE_SYSTEM | Freq: Once | INTRAUTERINE | Status: AC
  Administered 2021-07-30: 1 via INTRAUTERINE
  Filled 2021-07-30: qty 1

## 2021-07-30 MED ORDER — OXYCODONE-ACETAMINOPHEN 5-325 MG PO TABS: 2.0000 | ORAL_TABLET | ORAL | Status: DC | PRN

## 2021-07-30 MED ORDER — PRENATAL MULTIVITAMIN CH
1.0000 | ORAL_TABLET | Freq: Every day | ORAL | Status: DC
  Administered 2021-07-30 – 2021-07-31 (×2): 1 via ORAL
  Filled 2021-07-30 (×2): qty 1

## 2021-07-30 MED ORDER — SIMETHICONE 80 MG PO CHEW
80.0000 mg | CHEWABLE_TABLET | ORAL | Status: DC | PRN
Start: 2021-07-30 — End: 2021-07-31

## 2021-07-30 MED ORDER — ONDANSETRON HCL 4 MG PO TABS: 4.0000 mg | ORAL_TABLET | ORAL | Status: DC | PRN

## 2021-07-30 MED ORDER — DIPHENHYDRAMINE HCL 25 MG PO CAPS: 25.0000 mg | ORAL_CAPSULE | Freq: Four times a day (QID) | ORAL | Status: DC | PRN

## 2021-07-30 MED ORDER — DIBUCAINE (PERIANAL) 1 % EX OINT: 1.0000 "application " | TOPICAL_OINTMENT | CUTANEOUS | Status: DC | PRN

## 2021-07-30 MED ORDER — TETANUS-DIPHTH-ACELL PERTUSSIS 5-2.5-18.5 LF-MCG/0.5 IM SUSY: 0.5000 mL | PREFILLED_SYRINGE | Freq: Once | INTRAMUSCULAR | Status: DC

## 2021-07-30 MED ORDER — EPHEDRINE 5 MG/ML INJ: 10.0000 mg | INTRAVENOUS | Status: DC | PRN

## 2021-07-30 MED ORDER — LACTATED RINGERS IV SOLN: 500.0000 mL | INTRAVENOUS | Status: DC | PRN

## 2021-07-30 MED ORDER — ACETAMINOPHEN 325 MG PO TABS: 650.0000 mg | ORAL_TABLET | ORAL | Status: DC | PRN

## 2021-07-30 MED ORDER — LIDOCAINE HCL (PF) 1 % IJ SOLN
INTRAMUSCULAR | Status: DC | PRN
  Administered 2021-07-30: 6 mL via EPIDURAL

## 2021-07-30 MED ORDER — OXYCODONE-ACETAMINOPHEN 5-325 MG PO TABS: 1.0000 | ORAL_TABLET | ORAL | Status: DC | PRN

## 2021-07-30 MED ORDER — BENZOCAINE-MENTHOL 20-0.5 % EX AERO: 1.0000 "application " | INHALATION_SPRAY | CUTANEOUS | Status: DC | PRN

## 2021-07-30 MED ORDER — ONDANSETRON HCL 4 MG/2ML IJ SOLN: 4.0000 mg | Freq: Four times a day (QID) | INTRAMUSCULAR | Status: DC | PRN

## 2021-07-30 MED ORDER — LACTATED RINGERS IV SOLN: INTRAVENOUS | Status: DC

## 2021-07-30 MED ORDER — MORPHINE SULFATE (PF) 4 MG/ML IV SOLN
5.0000 mg | Freq: Once | INTRAVENOUS | Status: DC
  Filled 2021-07-30: qty 2

## 2021-07-30 MED ORDER — OXYTOCIN BOLUS FROM INFUSION
333.0000 mL | Freq: Once | INTRAVENOUS | Status: AC
  Administered 2021-07-30: 333 mL via INTRAVENOUS

## 2021-07-30 MED ORDER — ZOLPIDEM TARTRATE 5 MG PO TABS: 5.0000 mg | ORAL_TABLET | Freq: Every evening | ORAL | Status: DC | PRN

## 2021-07-30 MED ORDER — ONDANSETRON HCL 4 MG/2ML IJ SOLN
4.0000 mg | INTRAMUSCULAR | Status: DC | PRN
Start: 2021-07-30 — End: 2021-07-31

## 2021-07-30 MED ORDER — DIPHENHYDRAMINE HCL 50 MG/ML IJ SOLN: 12.5000 mg | INTRAMUSCULAR | Status: DC | PRN

## 2021-07-30 MED ORDER — ACETAMINOPHEN 325 MG PO TABS
650.0000 mg | ORAL_TABLET | ORAL | Status: DC | PRN
  Administered 2021-07-31 (×2): 650 mg via ORAL
  Filled 2021-07-30 (×2): qty 2

## 2021-07-30 MED ORDER — SENNOSIDES-DOCUSATE SODIUM 8.6-50 MG PO TABS
2.0000 | ORAL_TABLET | Freq: Every day | ORAL | Status: DC
  Administered 2021-07-31: 2 via ORAL
  Filled 2021-07-30: qty 2

## 2021-07-30 MED ORDER — SOD CITRATE-CITRIC ACID 500-334 MG/5ML PO SOLN: 30.0000 mL | ORAL | Status: DC | PRN

## 2021-07-30 MED ORDER — LIDOCAINE HCL (PF) 1 % IJ SOLN: 30.0000 mL | INTRAMUSCULAR | Status: DC | PRN

## 2021-07-30 NOTE — Lactation Note (Signed)
This note was copied from a baby's chart. Lactation Consultation Note  Patient Name: Daisy Ford [id number] Date: 07/30/2021 Reason for consult: L&D Initial assessment;Term Age:30 hours   Initial L&D Consult:  Visited with family < 1 hour after birth Mother had baby latched; intermittent sucks noted; mother denied pain with latching.  Father at bedside and family talking on the phone to family members.  Reassured parents that lactation services will be available on the M/B unit.  Allowed time for family bonding.   Maternal Data    Feeding Mother's Current Feeding Choice: Breast Milk and Formula  LATCH Score Latch: Repeated attempts needed to sustain latch, nipple held in mouth throughout feeding, stimulation needed to elicit sucking reflex.  Audible Swallowing: None  Type of Nipple:  (Not assessed due to baby being latched when I arrived)  Comfort (Breast/Nipple):  (Not assessed due to baby being latched prior to my arrival)  Hold (Positioning): No assistance needed to correctly position infant at breast.      Lactation Tools Discussed/Used    Interventions Interventions: Skin to skin  Discharge    Consult Status Consult Status: Follow-up from L&D    Taron Mondor R Zandra Lajeunesse 07/30/2021, 9:12 AM

## 2021-07-30 NOTE — H&P (Signed)
Daisy Ford is a 31 y.o. [phone] female at [redacted]w[redacted]d by LMP c/w 7wk u/s presenting in labor.   Reports active fetal movement, contractions: regular since 0300, vaginal bleeding: none, membranes: intact.  Initiated prenatal care at [facility] at 12 wks.   Most recent u/s 4/21 @ 36.1, EFW 50%, AFI 12.6cm, vtx.   This pregnancy complicated by: Fetal arrhythmia that resolved  Prenatal History/Complications:  Term uncomplicated SVB x1, SAB x 2  Past Medical History: Past Medical History:  Diagnosis Date   GERD (gastroesophageal reflux disease)    Miscarriage     Past Surgical History: Past Surgical History:  Procedure Laterality Date   APPENDECTOMY      Obstetrical History: OB History     Gravida  4   Para  1   Term  1   Preterm      AB  2   Living  1      SAB  2   IAB      Ectopic      Multiple  0   Live Births  1           Social History: Social History   Socioeconomic History   Marital status: Married    Spouse name: Not on file   Number of children: Not on file   Years of education: Not on file   Highest education level: Not on file  Occupational History   Not on file  Tobacco Use   Smoking status: Never   Smokeless tobacco: Never  Vaping Use   Vaping Use: Never used  Substance and Sexual Activity   Alcohol use: No   Drug use: No   Sexual activity: Yes    Birth control/protection: None  Other Topics Concern   Not on file  Social History Narrative   Not on file   Social Determinants of Health   Financial Resource Strain: Low Risk    Difficulty of Paying Living Expenses: Not very hard  Food Insecurity: No Food Insecurity   Worried About Running Out of Food in the Last Year: Never true   Ran Out of Food in the Last Year: Never true  Transportation Needs: No Transportation Needs   Lack of Transportation (Medical): No   Lack of Transportation (Non-Medical): No  Physical Activity: Insufficiently Active   Days of Exercise per  Week: 1 day   Minutes of Exercise per Session: 20 min  Stress: No Stress Concern Present   Feeling of Stress : Not at all  Social Connections: Moderately Integrated   Frequency of Communication with Friends and Family: Three times a week   Frequency of Social Gatherings with Friends and Family: Once a week   Attends Religious Services: More than 4 times per year   Active Member of [employer] or Organizations: No   Attends Archivist Meetings: Never   Marital Status: Married    Family History: Family History  Problem Relation Age of Onset   Cancer Paternal Grandfather        prostate    Allergies: Allergies  Allergen Reactions   Caffeine Palpitations    Medications Prior to Admission  Medication Sig Dispense Refill Last Dose   pantoprazole (PROTONIX) 20 MG tablet Take 1 tablet (20 mg total) by mouth daily. 30 tablet 2 07/29/2021   Prenatal Vit-Fe Fumarate-FA (MULTIVITAMIN-PRENATAL) 27-0.8 MG TABS tablet Take 1 tablet by mouth daily at 12 noon.   07/29/2021   Blood Pressure Monitor MISC For regular home bp monitoring  during pregnancy (Patient not taking: Reported on 04/02/2021) 1 each 0     Review of Systems  Pertinent pos/neg as indicated in HPI  Blood pressure 119/68, pulse 85, temperature 98.1 F (36.7 C), temperature source Oral, resp. rate 17, height 5\' 5"  (1.651 m), weight 75.8 kg, last menstrual period 10/17/2020, SpO2 99 %. General appearance: alert, cooperative, and no distress Lungs: clear to auscultation bilaterally Heart: regular rate and rhythm Abdomen: gravid, soft, non-tender Extremities: tr edema  Fetal monitoring: FHR: 135 bpm, variability: moderate,  Accelerations: Present,  decelerations:  Absent Uterine activity: q 2-66mins Dilation: 5.5 Effacement (%): 90 Station: -2 Exam by:: A. Tuttle Presentation: cephalic   Prenatal labs: ABO, Rh: --/--/PENDING (05/24 0140) Antibody: PENDING (05/24 0140) Rubella: 10.70 (11/09 1606) RPR: Non Reactive  (02/22 0817)  HBsAg: Negative (11/09 1606)  HIV: Non Reactive (02/22 0817)  GBS: Negative/-- (04/21 1500)  2hr GTT: normal  Results for orders placed or performed during the hospital encounter of 07/29/21 (from the past 24 hour(s))  CBC   Collection Time: 07/30/21  1:40 AM  Result Value Ref Range   WBC 12.9 (H) 4.0 - 10.5 K/uL   RBC 4.29 3.87 - 5.11 MIL/uL   Hemoglobin 14.0 12.0 - 15.0 g/dL   HCT 41.7 36.0 - 46.0 %   MCV 97.2 80.0 - 100.0 fL   MCH 32.6 26.0 - 34.0 pg   MCHC 33.6 30.0 - 36.0 g/dL   RDW 12.8 11.5 - 15.5 %   Platelets 267 150 - 400 K/uL   nRBC 0.0 0.0 - 0.2 %  Type and screen [place]   Collection Time: 07/30/21  1:40 AM  Result Value Ref Range   ABO/RH(D) PENDING    Antibody Screen PENDING    Sample Expiration      08/02/2021,2359 Performed at [facility], [place] [address] [place], [place] [address]      Assessment:  [redacted]w[redacted]d SIUP  [id number]  Labor  Cat 1 FHR  GBS Negative/-- (04/21 1500)  Plan:  Admit to L&D  IV pain meds/epidural prn active labor  Expectant management  Anticipate NSVB   Plans to breast & bottlefeed  Contraception: pp IUD  Circumcision: yes  Roma Schanz CNM, WHNP-BC 07/30/2021, 2:26 AM

## 2021-07-30 NOTE — Discharge Instructions (Addendum)
INSTRUCCIONES POSTERIORES AL PROCEDIMIENTO PARA LA COLOCACIN DEL DIU  1. Puede tomar ibuprofeno, Aleve o Tylenol para el dolor si es necesario. Los calambres deben resolverse en 24 horas.  2. Es posible que tenga una pequea cantidad de [place]. Debe usar Physiological scientist mini [employer] [address].  3. Puede tener relaciones despus de 24 horas. Si [place] esto para el control de la natalidad, es efectivo de inmediato.  4. Debe llamar si tiene dolor plvico, fiebre, sangrado abundante o secrecin vaginal maloliente. El sangrado irregular es comn durante los primeros meses despus de la colocacin de un DIU. No necesita llamar por este motivo a menos que est preocupado.  5. Dchese o bese normalmente  6. Debe tener una cita de seguimiento en 4 a 6 semanas para una nueva revisin y asegurarse de que no tenga ningn problema.

## 2021-07-30 NOTE — Lactation Note (Signed)
This note was copied from a baby's chart. Lactation Consultation Note  Patient Name: Daisy Ford KZLDJ'T Date: 07/30/2021   Age:30 hours  Per the RN, mom does not want assistance from lactation. She will call if needed.    Daisy Ford P Daisy Ford 07/30/2021, 2:19 PM

## 2021-07-30 NOTE — Anesthesia Preprocedure Evaluation (Signed)
Anesthesia Evaluation  Patient identified by MRN, date of birth, ID band Patient awake    Reviewed: Allergy & Precautions, H&P , NPO status , Patient's Chart, lab work & pertinent test results  History of Anesthesia Complications Negative for: history of anesthetic complications  Airway Mallampati: II  TM Distance: >3 FB Neck ROM: full    Dental no notable dental hx. (+) Teeth Intact   Pulmonary neg pulmonary ROS,    Pulmonary exam normal breath sounds clear to auscultation       Cardiovascular negative cardio ROS Normal cardiovascular exam Rhythm:regular Rate:Normal     Neuro/Psych negative neurological ROS  negative psych ROS   GI/Hepatic Neg liver ROS, GERD  Medicated,  Endo/Other  negative endocrine ROS  Renal/GU negative Renal ROS  negative genitourinary   Musculoskeletal   Abdominal   Peds  Hematology negative hematology ROS (+)   Anesthesia Other Findings   Reproductive/Obstetrics (+) Pregnancy                             Anesthesia Physical Anesthesia Plan  ASA: 2  Anesthesia Plan: Epidural   Post-op Pain Management:    Induction: Intravenous  PONV Risk Score and Plan: 2 and Treatment may vary due to age or medical condition  Airway Management Planned: Natural Airway  Additional Equipment:   Intra-op Plan:   Post-operative Plan:   Informed Consent: I have reviewed the patients History and Physical, chart, labs and discussed the procedure including the risks, benefits and alternatives for the proposed anesthesia with the patient or authorized representative who has indicated his/her understanding and acceptance.     Interpreter used for [employer] Discussed with: Anesthesiologist  Anesthesia Plan Comments:         Anesthesia Quick Evaluation

## 2021-07-30 NOTE — Anesthesia Postprocedure Evaluation (Signed)
Anesthesia Post Note  Patient: Daisy Ford  Procedure(s) Performed: AN AD HOC LABOR EPIDURAL     Patient location during evaluation: Mother Baby Anesthesia Type: Epidural Level of consciousness: awake, awake and alert and oriented Pain management: pain level controlled Vital Signs Assessment: post-procedure vital signs reviewed and stable Respiratory status: spontaneous breathing, nonlabored ventilation and respiratory function stable Cardiovascular status: stable Postop Assessment: no headache, patient able to bend at knees, no apparent nausea or vomiting, adequate PO intake and able to ambulate Anesthetic complications: no   No notable events documented.  Last Vitals:  Vitals:   07/30/21 1159 07/30/21 1530  BP: 122/64 92/60  Pulse: 94 82  Resp: 18 16  Temp: 36.7 C 36.9 C  SpO2: 99% 100%    Last Pain:  Vitals:   07/30/21 1530  TempSrc: Oral  PainSc: 3    Pain Goal: Patients Stated Pain Goal: 2 (07/30/21 1530)                 Devonne Lalani

## 2021-07-30 NOTE — Anesthesia Procedure Notes (Signed)
Epidural Patient location during procedure: OB Start time: 07/30/2021 2:23 AM End time: 07/30/2021 2:33 AM  Staffing Anesthesiologist: Mellody Dance, MD Performed: anesthesiologist   Preanesthetic Checklist Completed: patient identified, IV checked, site marked, risks and benefits discussed, monitors and equipment checked, pre-op evaluation and timeout performed  Epidural Patient position: sitting Prep: DuraPrep Patient monitoring: heart rate, cardiac monitor, continuous pulse ox and blood pressure Approach: midline Location: L2-L3 Injection technique: LOR saline  Needle:  Needle type: Tuohy  Needle gauge: 17 G Needle length: 9 cm Needle insertion depth: 6 cm Catheter type: closed end flexible Catheter size: 20 Guage Catheter at skin depth: 11 cm Test dose: negative and Other  Assessment Events: blood not aspirated, injection not painful, no injection resistance and negative IV test  Additional Notes Informed consent obtained prior to proceeding including risk of failure, 1% risk of PDPH, risk of minor discomfort and bruising.  Discussed rare but serious complications including epidural abscess, permanent nerve injury, epidural hematoma.  Discussed alternatives to epidural analgesia and patient desires to proceed.  Timeout performed pre-procedure verifying patient name, procedure, and platelet count.  Patient tolerated procedure well.

## 2021-07-30 NOTE — Procedures (Addendum)
Post-Placental IUD Insertion Procedure Note  Patient identified, informed consent signed prior to delivery, signed copy in chart, time out was performed.    Vaginal, labial and perineal areas thoroughly inspected for lacerations. 2nd degree perineal laceration identified - not hemostatic, not repaired prior to insertion of IUD.  Mirena grasped between sterile gloved fingers. Sterile lubrication applied to sterile gloved hand for ease of insertion. Fundus identified through abdominal wall using non-insertion hand. IUD inserted to fundus with bimanual technique. IUD carefully released at the fundus and insertion hand gently removed from vagina. Strings trimmed to the level of the introitus. Patient tolerated procedure well.  Patient given post procedure instructions and IUD care card with expiration date.  Patient is asked to keep IUD strings tucked in her vagina until her postpartum follow up visit in 4-6 weeks. Patient advised to abstain from sexual intercourse and pulling on strings before her follow-up visit. Patient verbalized an understanding of the plan of care and agrees.   Lot #: [id number]  Exp Date: 07/2023  Laury Deep, CNM  07/30/2021 9:11 AM

## 2021-07-30 NOTE — Progress Notes (Signed)
Patient ID: Daisy Ford, female   DOB: 10/18/1990, 31 y.o.   MRN: [id number] Daisy Ford is a 31 y.o. [phone] at [redacted]w[redacted]d admitted for active labor  Subjective: comfortable with epidural and sleeping  Objective: BP (!) 97/53   Pulse 83   Temp 98.1 F (36.7 C) (Oral)   Resp 16   Ht 5\' 5"  (1.651 m)   Wt 75.8 kg   LMP 10/17/2020 (Exact Date)   SpO2 97%   BMI 27.79 kg/m  No intake/output data recorded.  FHR baseline 140 bpm, Variability: moderate, Accelerations:present, Decelerations:  Absent Toco: q 2-101min   SVE:   Dilation: 10 Effacement (%): 100 Station: 0 Exam by:: A. Pope RN  Labs: Lab Results  Component Value Date   WBC 12.9 (H) 07/30/2021   HGB 14.0 07/30/2021   HCT 41.7 07/30/2021   MCV 97.2 07/30/2021   PLT 267 07/30/2021    Assessment / Plan: Spontaneous labor, progressing normally, complete, asleep  Labor: active Fetal Wellbeing:  Category I Pain Control:  epidural Pre-eclampsia: N/A I/D:  GBS neg Anticipated MOD: NSVB  Roma Schanz CNM, WHNP-BC 07/30/2021, 7:35 AM

## 2021-07-30 NOTE — Discharge Summary (Signed)
Postpartum Discharge Summary    Patient Name: Daisy Ford DOB: [birth year] MRN: [id number]  Date of admission: 07/29/2021 Delivery date:07/30/2021  Delivering provider: Laury Deep  Date of discharge: 07/31/2021  Admitting diagnosis: Indication for care in labor and delivery, antepartum [O75.9] Intrauterine pregnancy: [redacted]w[redacted]d    Secondary diagnosis:  Principal Problem:   Vaginal delivery Active Problems:   History of multiple miscarriages   Encounter for supervision of normal pregnancy, antepartum   Language barrier   Contraception management   IUD (intrauterine device) in place  Additional problems: None   Discharge diagnosis:  PostTerm Pregnancy Delivered                                               Post partum procedures: Post-placental IUD placement Augmentation: N/A Complications: None  Hospital course: Onset of Labor With Vaginal Delivery      31y.o. yo [id number] 47w6dasat 47w6das admitted in Active Labor on 07/29/2021. Patient had an uncomplicated labor course as follows:  Membrane Rupture Time/Date: 5:43 AM ,07/30/2021   Delivery Method:Vaginal, Spontaneous  Episiotomy: None  Lacerations:  2nd degree;Perineal  Patient had an uncomplicated postpartum course.  She is ambulating, tolerating a regular diet, passing flatus, and urinating well.  Her pain and bleeding are controlled.  She is breast and formula feeding well.  Patient is discharged home in stable condition on 07/31/21.  Newborn Data: Birth date:07/30/2021  Birth time:8:28 AM  Gender:Female  Living status:Living  Apgars:9 ,9  [id number]   Magnesium Sulfate received: No BMZ received: No Rhophylac: N/A MMR: N/A - IMMUNE T-DaP: Given prenatally Flu: No Transfusion: No  Physical exam  Vitals:   07/30/21 1159 07/30/21 1530 07/30/21 1940 07/31/21 0325  BP: 122/64 92/60 112/70 101/75  Pulse: 94 82 87 83  Resp: 18 16 16 16   Temp: 98.1 F (36.7 C) 98.4 F (36.9 C) 99 F (37.2 C) 98.4 F  (36.9 C)  TempSrc: Oral Oral Oral Oral  SpO2: 99% 100% 100% 99%  Weight:      Height:       General: alert, cooperative, and no distress Lochia: appropriate Uterine Fundus: firm and below umbilicus  DVT Evaluation: no LE edema or calf tenderness to palpation   Labs: Lab Results  Component Value Date   WBC 12.6 (H) 07/31/2021   HGB 11.9 (L) 07/31/2021   HCT 34.7 (L) 07/31/2021   MCV 95.9 07/31/2021   PLT 218 07/31/2021      Latest Ref Rng & Units 07/21/2021    8:03 PM  CMP  Glucose 70 - 99 mg/dL 86    BUN 6 - 20 mg/dL 13    Creatinine 0.44 - 1.00 mg/dL 0.61    Sodium 135 - 145 mmol/L 135    Potassium 3.5 - 5.1 mmol/L 3.5    Chloride 98 - 111 mmol/L 106    CO2 22 - 32 mmol/L 22    Calcium 8.9 - 10.3 mg/dL 9.0    Total Protein 6.5 - 8.1 g/dL 6.2    Total Bilirubin 0.3 - 1.2 mg/dL 0.4    Alkaline Phos 38 - 126 U/L 111    AST 15 - 41 U/L 21    ALT 0 - 44 U/L 18     Edinburgh Score:    07/31/2021    7:30 AM  Edinburgh Postnatal Depression  Scale Screening Tool  I have been able to laugh and see the funny side of things. 0  I have looked forward with enjoyment to things. 0  I have blamed myself unnecessarily when things went wrong. 1  I have been anxious or worried for no good reason. 2  I have felt scared or panicky for no good reason. 0  Things have been getting on top of me. 1  I have been so unhappy that I have had difficulty sleeping. 0  I have felt sad or miserable. 1  I have been so unhappy that I have been crying. 0  The thought of harming myself has occurred to me. 0  Edinburgh Postnatal Depression Scale Total 5     After visit meds:  Allergies as of 07/31/2021       Reactions   Caffeine Palpitations        Medication List     STOP taking these medications    Blood Pressure Monitor Misc   pantoprazole 20 MG tablet Commonly known as: Protonix       TAKE these medications    acetaminophen 500 MG tablet Commonly known as: TYLENOL Take 2  tablets (1,000 mg total) by mouth every 8 (eight) hours as needed (pain).   ibuprofen 600 MG tablet Commonly known as: ADVIL Take 1 tablet (600 mg total) by mouth every 6 (six) hours as needed (pain).   multivitamin-prenatal 27-0.8 MG Tabs tablet Take 1 tablet by mouth daily at 12 noon.         Discharge home in stable condition Infant Feeding: Bottle and Breast Infant Disposition: home with mother Discharge instruction: per After Visit Summary and Postpartum booklet. Activity: Advance as tolerated. Pelvic rest for 6 weeks.  Diet: routine diet Future Appointments: Future Appointments  Date Time Provider [place]  09/11/2021 10:30 AM Cresenzo-Dishmon, Joaquim Lai, CNM [facility]   Follow up Visit:  Follow-up Information     [facility]. Schedule an appointment as soon as possible for a visit in 6 week(s).   Specialty: Obstetrics and Gynecology Why: postpartum visit Contact information: [place] [phone]               Message sent to [facility] Admin Rep on 07/30/2021 by R. Renato Battles, CNM: Please schedule this patient for a In person postpartum visit in 6 weeks with the following provider: Any provider. Additional Postpartum F/U: IUD string check (trim)   Low risk pregnancy complicated by:  None Delivery mode:  Vaginal, Spontaneous  Anticipated Birth Control:  PP IUD placed  In-person [facility] Spanish interpreter used for entirety of encounter.   07/31/2021 Genia Del, MD

## 2021-07-31 ENCOUNTER — Inpatient Hospital Stay (HOSPITAL_COMMUNITY)
Admission: AD | Admit: 2021-07-31 | Payer: Medicaid Other | Source: Home / Self Care | Admitting: Obstetrics & Gynecology

## 2021-07-31 ENCOUNTER — Inpatient Hospital Stay (HOSPITAL_COMMUNITY): Payer: Medicaid Other

## 2021-07-31 DIAGNOSIS — Z975 Presence of (intrauterine) contraceptive device: Secondary | ICD-10-CM

## 2021-07-31 LAB — CBC
HCT: 34.7 % — ABNORMAL LOW (ref 36.0–46.0)
Hemoglobin: 11.9 g/dL — ABNORMAL LOW (ref 12.0–15.0)
MCH: 32.9 pg (ref 26.0–34.0)
MCHC: 34.3 g/dL (ref 30.0–36.0)
MCV: 95.9 fL (ref 80.0–100.0)
Platelets: 218 10*3/uL (ref 150–400)
RBC: 3.62 MIL/uL — ABNORMAL LOW (ref 3.87–5.11)
RDW: 12.9 % (ref 11.5–15.5)
WBC: 12.6 10*3/uL — ABNORMAL HIGH (ref 4.0–10.5)
nRBC: 0 % (ref 0.0–0.2)

## 2021-07-31 MED ORDER — ACETAMINOPHEN 500 MG PO TABS: 1000.0000 mg | ORAL_TABLET | Freq: Three times a day (TID) | ORAL | 0 refills | Status: DC | PRN

## 2021-07-31 MED ORDER — IBUPROFEN 600 MG PO TABS
600.0000 mg | ORAL_TABLET | Freq: Four times a day (QID) | ORAL | 0 refills | Status: DC | PRN
Start: 2021-07-31 — End: 2022-09-16

## 2021-08-08 ENCOUNTER — Telehealth (HOSPITAL_COMMUNITY): Payer: Self-pay | Admitting: *Deleted

## 2021-08-08 NOTE — Telephone Encounter (Signed)
Left phone voicemail message.  Duffy Rhody, RN 08-08-2021 at 10:08am

## 2021-09-11 ENCOUNTER — Ambulatory Visit (INDEPENDENT_AMBULATORY_CARE_PROVIDER_SITE_OTHER): Payer: Medicaid Other | Admitting: Advanced Practice Midwife

## 2021-09-11 ENCOUNTER — Encounter: Payer: Self-pay | Admitting: Advanced Practice Midwife

## 2021-09-11 DIAGNOSIS — Z30431 Encounter for routine checking of intrauterine contraceptive device: Secondary | ICD-10-CM

## 2021-09-11 MED ORDER — TRI-LUMA 0.01-4-0.05 % EX CREA: TOPICAL_CREAM | CUTANEOUS | 6 refills | Status: DC

## 2021-09-11 NOTE — Progress Notes (Signed)
Post Partum Visit Note   Chief Complaint:   Postpartum Care  History of Present Illness:   Daisy Ford is a 31 y.o. G4P2022 Hispanic female being seen today for a postpartum visit. She is 6 weeks postpartum following a spontaneous vaginal delivery at 40.6 gestational weeks. IOL: No,. Anesthesia: epidural.  Laceration: 2nd degree.  Complications: none. Inpatient contraception: yes Mirena IUD .   Pregnancy uncomplicated. Tobacco use: no. Substance use disorder: no. Last pap smear: 07/03/19 and results were NILM w/ HRHPV negative. Next pap smear due: 06/2022 /No LMP recorded. (Menstrual status: Lactating).  Postpartum course has been uncomplicated. Bleeding staining only. Bowel function is normal. Bladder function is normal. Urinary incontinence? No, fecal incontinence? No Patient is not sexually active. Last sexual activity: prior to birth .    Upstream - 09/11/21 1034       Pregnancy Intention Screening   Does the patient want to become pregnant in the next year? No    Does the patient's partner want to become pregnant in the next year? No    Would the patient like to discuss contraceptive options today? No      Contraception Wrap Up   Current Method IUD or IUS    End Method IUD or IUS    Contraception Counseling Provided No            The pregnancy intention screening data noted above was reviewed. Potential methods of contraception were discussed. The patient elected to proceed with IUD or IUS.  Edinburgh Postpartum Depression Screening: Negative  Edinburgh Postnatal Depression Scale - 09/11/21 1035       Edinburgh Postnatal Depression Scale:  In the Past 7 Days   I have been able to laugh and see the funny side of things. 1    I have looked forward with enjoyment to things. 0    I have blamed myself unnecessarily when things went wrong. 0    I have been anxious or worried for no good reason. 2    I have felt scared or panicky for no good reason. 0    Things  have been getting on top of me. 2    I have been so unhappy that I have had difficulty sleeping. 0    I have felt sad or miserable. 0    I have been so unhappy that I have been crying. 0    The thought of harming myself has occurred to me. 0    Edinburgh Postnatal Depression Scale Total 5            Baby's course has been uncomplicated. Baby is feeding by breast and bottle: milk supply adequate. Infant has a pediatrician/family doctor? Yes.  Childcare strategy if returning to work/school: yes.  Pt has material needs met for her and baby: Yes.   Review of Systems:   Pertinent items are noted in HPI Denies Abnormal vaginal discharge w/ itching/odor/irritation, headaches, visual changes, shortness of breath, chest pain, abdominal pain, severe nausea/vomiting, or problems with urination or bowel movements. Pertinent History Reviewed:  Reviewed past medical,surgical, obstetrical and family history.  Reviewed problem list, medications and allergies. OB History  Gravida Para Term Preterm AB Living  4 2 2   2 2  SAB IAB Ectopic Multiple Live Births  2     0 2    # Outcome Date GA Lbr Len/2nd Weight Sex Delivery Anes PTL Lv  4 Term 07/30/21 [redacted]w[redacted]d / 02:45 7 lb 10.2 oz (3.464 kg) M   Vag-Spont EPI  LIV     Birth Comments: birth mark on baby's right upper arm  3 SAB 09/2019     SAB     2 Term 05/02/17 [redacted]w[redacted]d 10:08 / 00:43 7 lb 4.2 oz (3.295 kg) M Vag-Spont EPI N LIV  1 SAB            Physical Assessment:   Vitals:   09/11/21 1036  BP: 109/60  Pulse: 74  Weight: 148 lb (67.1 kg)  Height: 5' 5" (1.651 m)  Body mass index is 24.63 kg/m.  Objective:  Blood pressure 109/60, pulse 74, height 5' 5" (1.651 m), weight 148 lb (67.1 kg), currently breastfeeding.  General:  alert, cooperative, and no distress   Breasts:  negative  Lungs: Normal respiratory effort  Heart:  regular rate and rhythm  Abdomen: soft, non-tender   Vulva:  normal  Vagina: not evaluated  Cervix:  Normal.  IUD  strings long and visible, trimmed   Corpus: Well involuted  Adnexa:  not evaluated  Rectal Exam: no hemorrhoids          No results found for this or any previous visit (from the past 24 hour(s)).  Assessment & Plan:  1) Postpartum exam 2) 6 wks s/p spontaneous vaginal delivery 3) breast & bottle feeding 4) Depression screening 5) Contraception management: IUD string check  Essential components of care per ACOG recommendations:  1.  Mood and well being:  If positive depression screen, discussed and plan developed.  If using tobacco we discussed reduction/cessation and risk of relapse If current substance abuse, we discussed and referral to local resources was offered.   2. Infant care and feeding:  If breastfeeding, discussed returning to work, pumping, breastfeeding-associated pain, guidance regarding return to fertility while lactating if not using another method. If needed, patient was provided with a letter to be allowed to pump q 2-3hrs to support lactation in a private location with access to a refrigerator to store breastmilk.   Recommended that all caregivers be immunized for flu, pertussis and other preventable communicable diseases If pt does not have material needs met for her/baby, referred to local resources for help obtaining these.  3. Sexuality, contraception and birth spacing Provided guidance regarding sexuality, management of dyspareunia, and resumption of intercourse Discussed avoiding interpregnancy interval <6mths and recommended birth spacing of 18 months  4. Sleep and fatigue Discussed coping options for fatigue and sleep disruption Encouraged family/partner/community support of 4 hrs of uninterrupted sleep to help with mood and fatigue  5. Physical recovery  If pt had a C/S, assessed incisional pain and providing guidance on normal vs prolonged recovery If pt had a laceration, perineal healing and pain reviewed.  If urinary or fecal incontinence,  discussed management and referred to PT or uro/gyn if indicated  Patient is safe to resume physical activity. Discussed attainment of healthy weight.  6.  Chronic disease management Discussed pregnancy complications if any, and their implications for future childbearing and long-term maternal health. Review recommendations for prevention of recurrent pregnancy complications, such as aspirin to reduce risk of preeclampsia not applicable. Pt had GDM: No. If yes, 2hr GTT scheduled: not applicable. Reviewed medications and non-pregnant dosing including consideration of whether pt is breastfeeding using a reliable resource such as LactMed: not applicable Referred for f/u w/ PCP or subspecialist providers as indicated: not applicable  7. Health maintenance Mammogram at 30yo or earlier if indicated Pap smears as indicated  Meds:  Meds ordered this encounter  Medications     Fluocin-Hydroquinone-Tretinoin (TRI-LUMA) 0.01-4-0.05 % CREA    Sig: Apply daily to affected area    Dispense:  30 g    Refill:  6    In spanish please    Order Specific Question:   Supervising Provider    Answer:   EURE, LUTHER H [[id number]]  For melasma  Follow-up: prn  No orders of the defined types were placed in this encounter.      Frances Cresenzo-Dishmon DNP, CNM [facility] 09/11/2021 11:01 AM       

## 2022-04-01 IMAGING — US US OB < 14 WEEKS - US OB TV
1 series · 15 of 28 positions shown · non-contrast
Comparison: Obstetric ultrasound 09/19/2019

CLINICAL DATA: Pregnant patient in first-trimester pregnancy with
vaginal bleeding. Absent fetal heart tones with crown-rump length
6.1 mm on [DATE], now vaginal spotting.

EXAM:
OBSTETRIC <14 WK US AND TRANSVAGINAL OB US
TECHNIQUE: Both transabdominal and transvaginal ultrasound examinations were
performed for complete evaluation of the gestation as well as the
maternal uterus, adnexal regions, and pelvic cul-de-sac.
Transvaginal technique was performed to assess early pregnancy.

[Series 1: us ob < 14 weeks - us ob tv · 65 acquisitions, 15 frames shown]
[im 1/65]
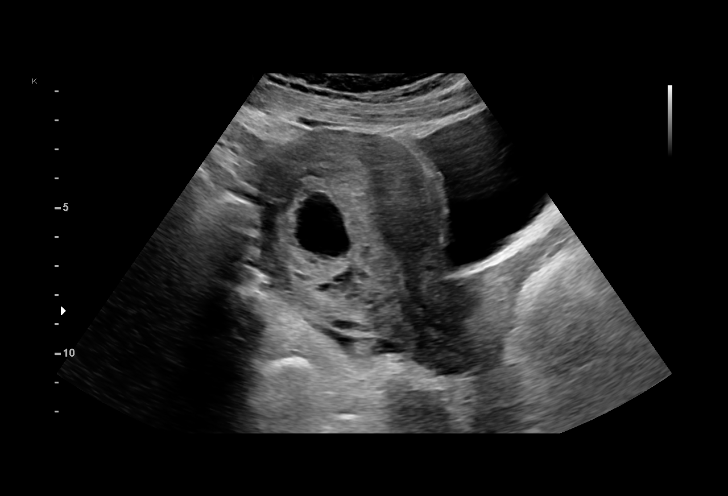
[im 5/65]
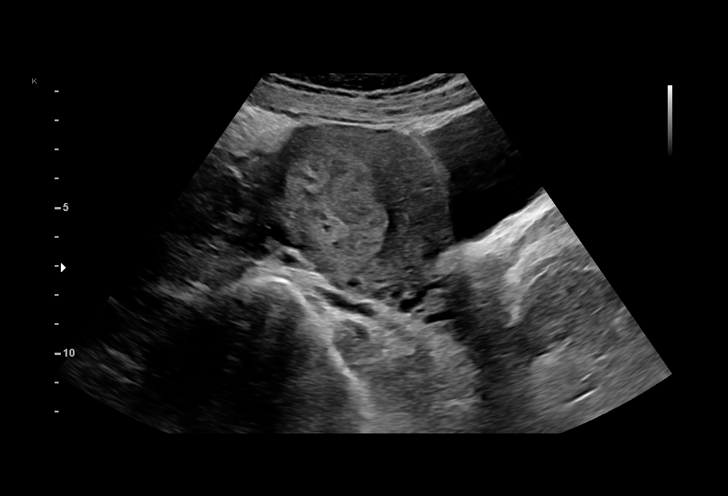
[im 10/65]
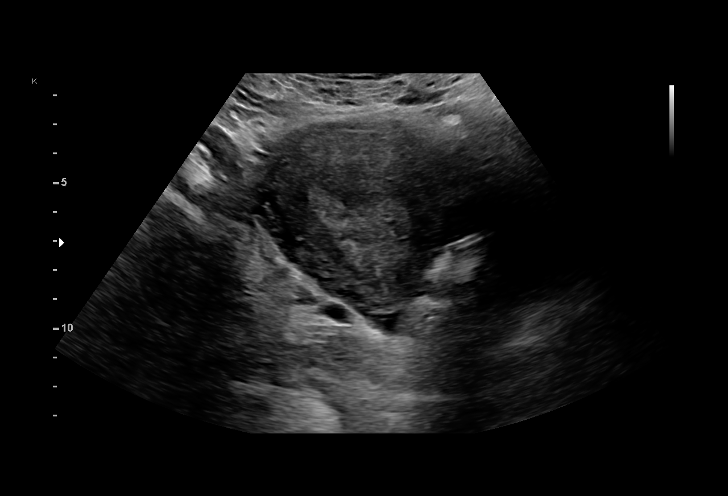
[im 15/65]
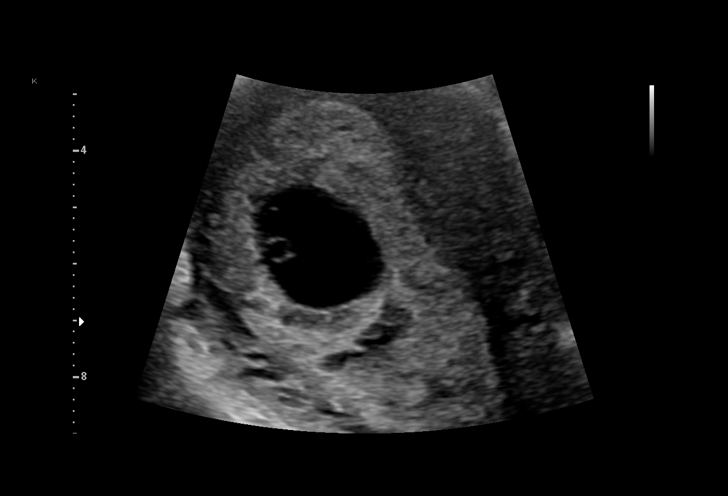
[im 19/65]
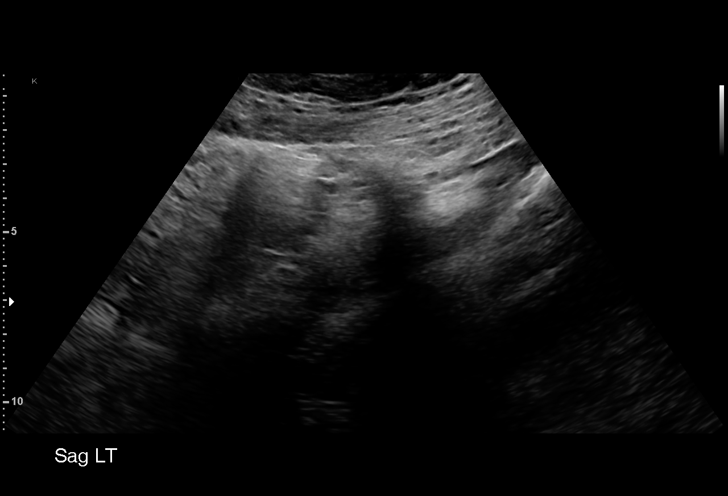
[im 24/65]
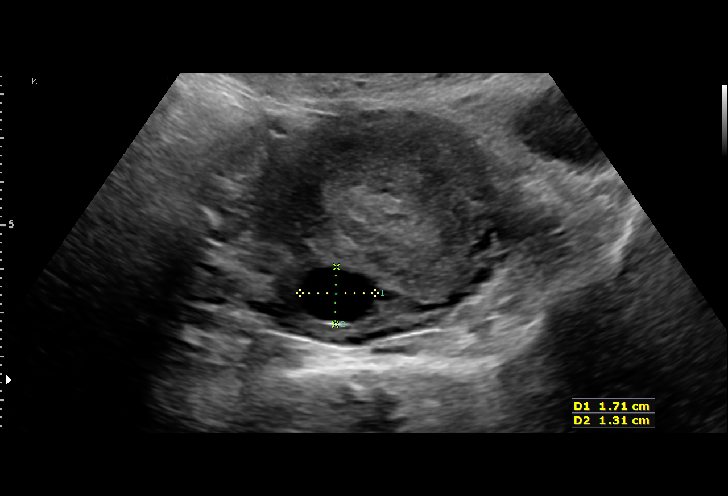
[im 29/65]
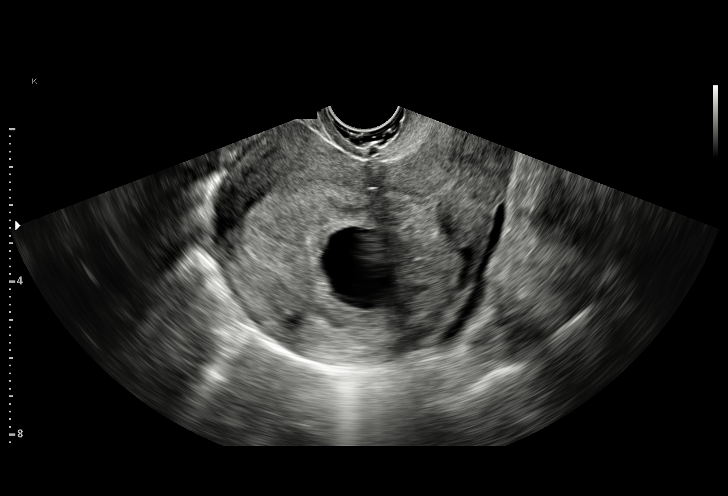
[im 34/65]
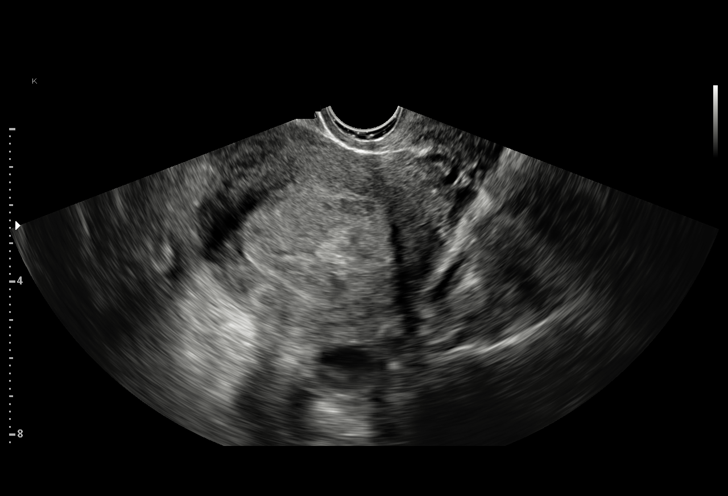
[im 36/65]
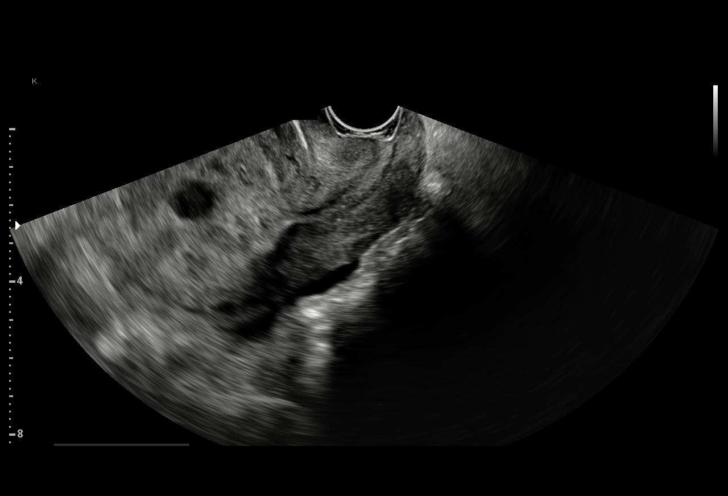
[im 41/65]
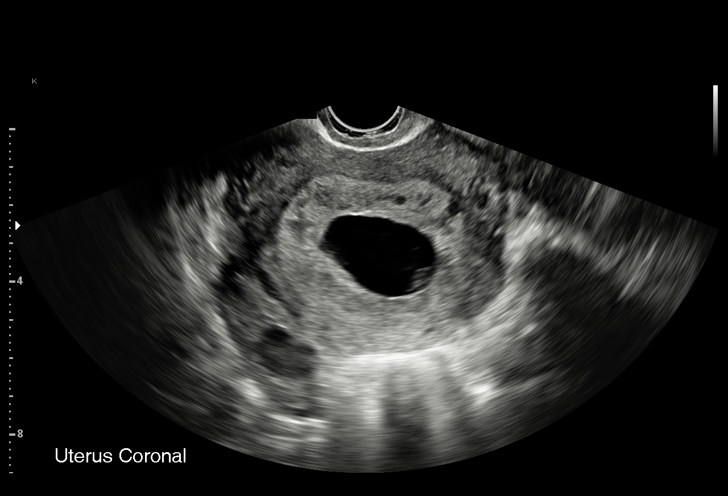
[im 46/65]
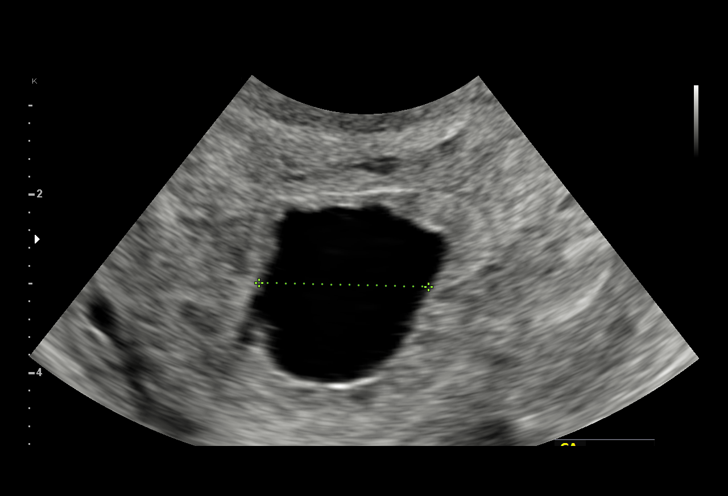
[im 50/65]
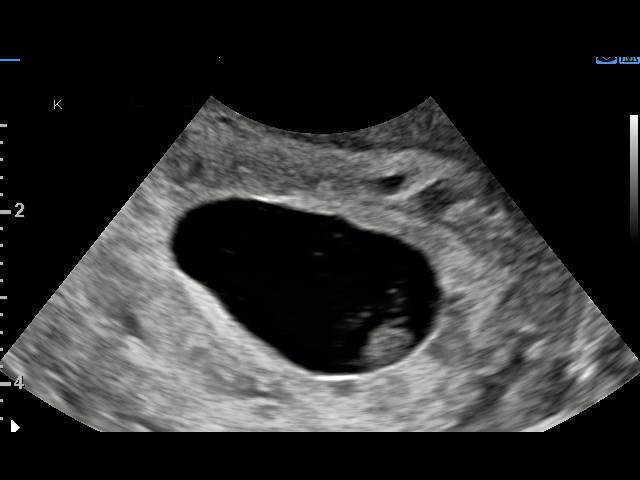
[im 55/65]
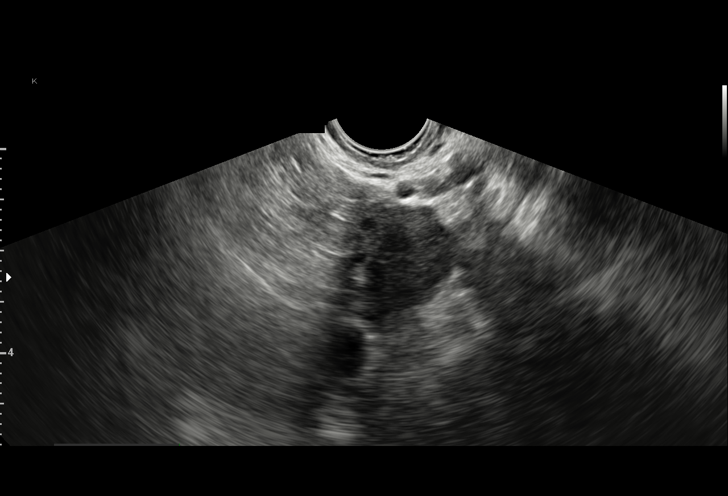
[im 60/65]
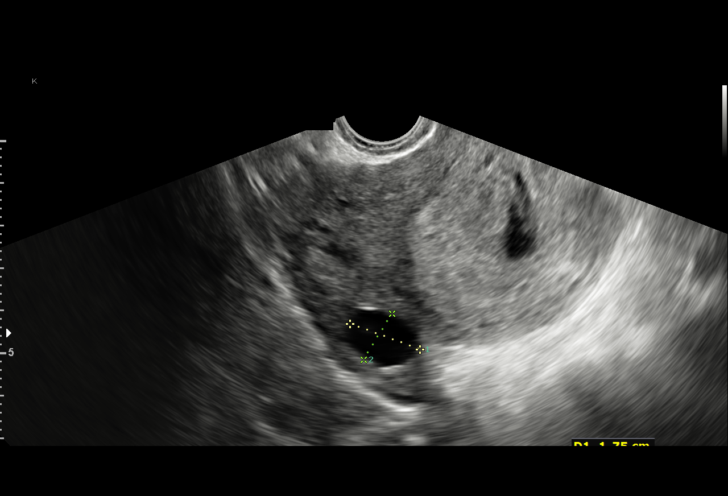
[im 65/65]
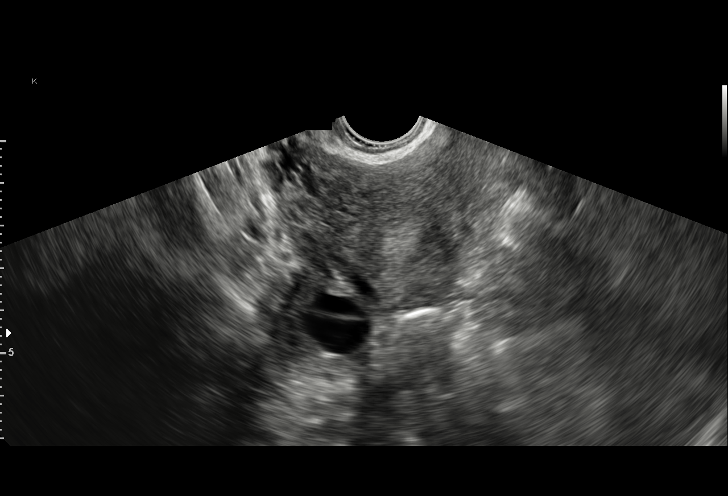

[15 of 28 positions shown; findings below may reference images not displayed]

FINDINGS: Intrauterine gestational sac: Single

Yolk sac:  Visualized.

Embryo:  No longer visualized.

Cardiac Activity: Not Visualized.

MSD: 23.3 mm   7 w   2 d

Subchorionic hemorrhage:  None visualized.

Maternal uterus/adnexae: The left ovary is well visualized and
normal. There is a 1.7 cm simple cyst in the right ovary. No
suspicious adnexal mass. No pelvic free fluid.
IMPRESSION: 1. Intrauterine gestational sac with yolk sac. The embryo on prior
exam is no longer seen. Findings meet definitive criteria for failed
pregnancy. This follows SRU consensus guidelines: Diagnostic
Criteria for Nonviable Pregnancy Early in the First Trimester. N
Engl J Med 1900;[DATE].
2. Simple cyst in the right ovary measures 1.7 cm.

## 2022-04-03 IMAGING — US US OB TRANSVAGINAL
1 series · 15 of 25 positions shown · non-contrast
Comparison: Ultrasound 09/21/2019

CLINICAL DATA: Miscarriage, quantitative beta hCG 0224, previous
09/21/2019, 1477)

EXAM:
TRANSVAGINAL OB ULTRASOUND
TECHNIQUE: Transvaginal ultrasound was performed for complete evaluation of the
gestation as well as the maternal uterus, adnexal regions, and
pelvic cul-de-sac.

[Series 1: us ob transvaginal · 25 acquisitions, 15 frames shown]
[im 1/25]
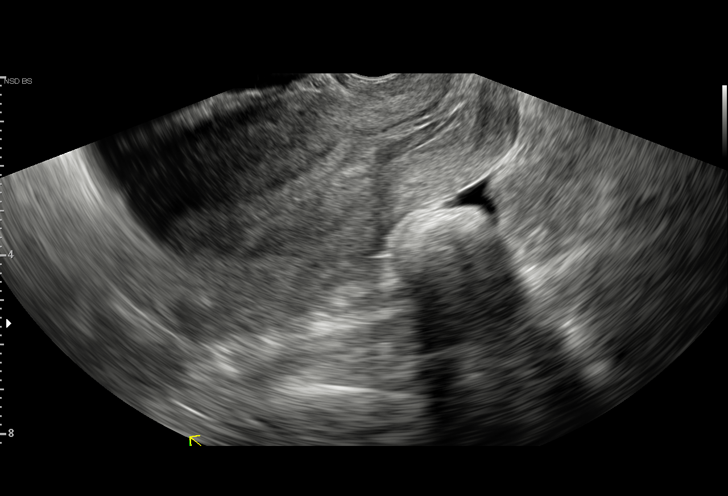
[im 3/25]
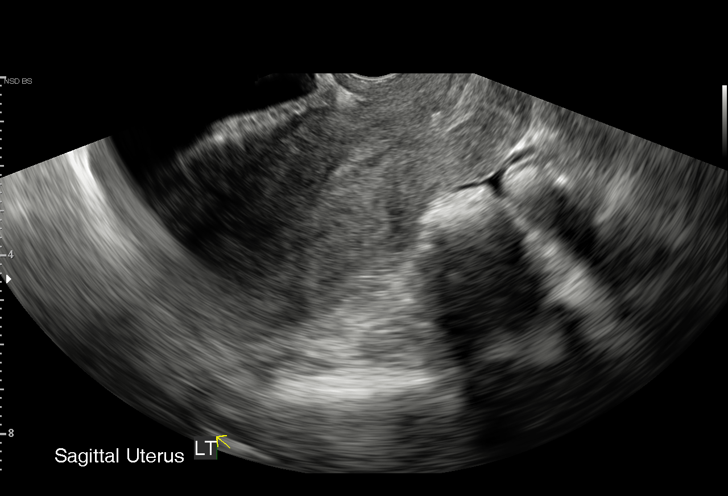
[im 5/25]
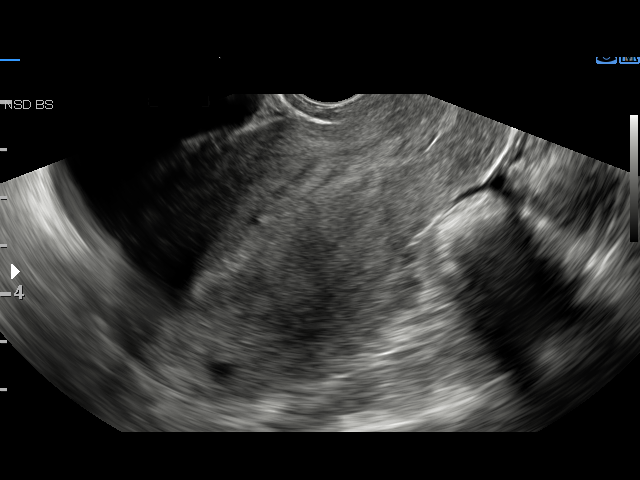
[im 6/25]
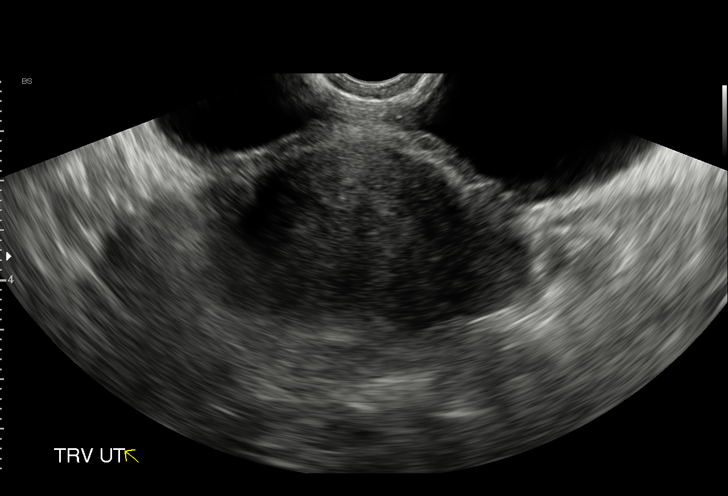
[im 8/25]
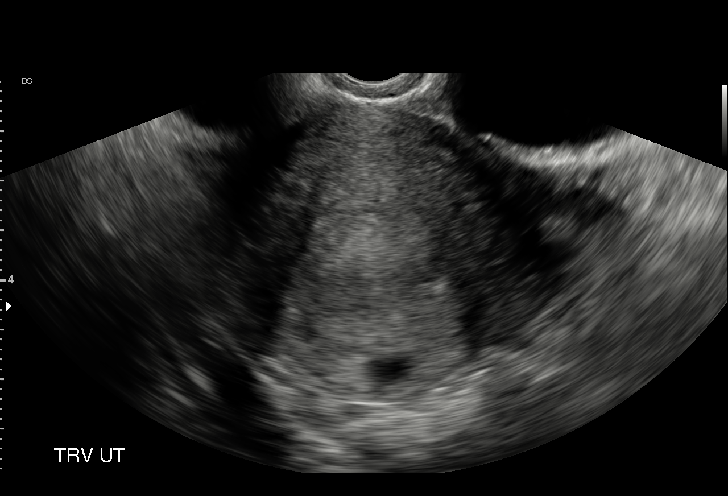
[im 10/25]
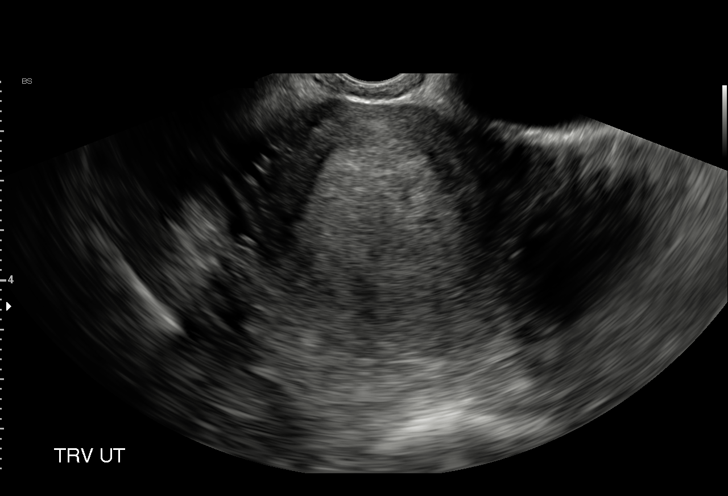
[im 11/25]
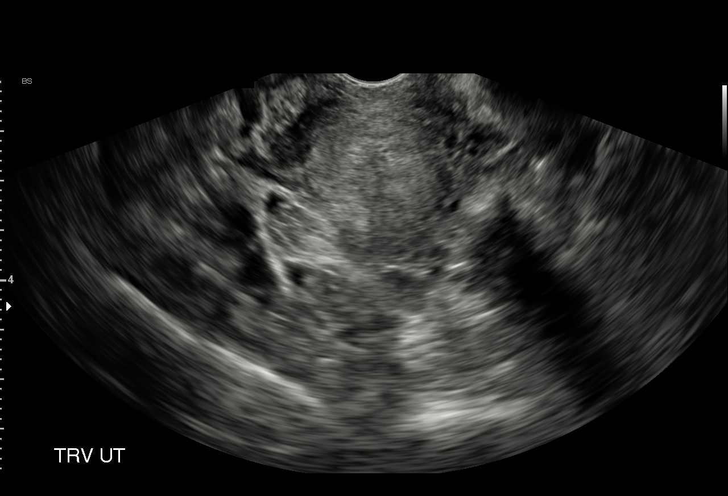
[im 13/25]
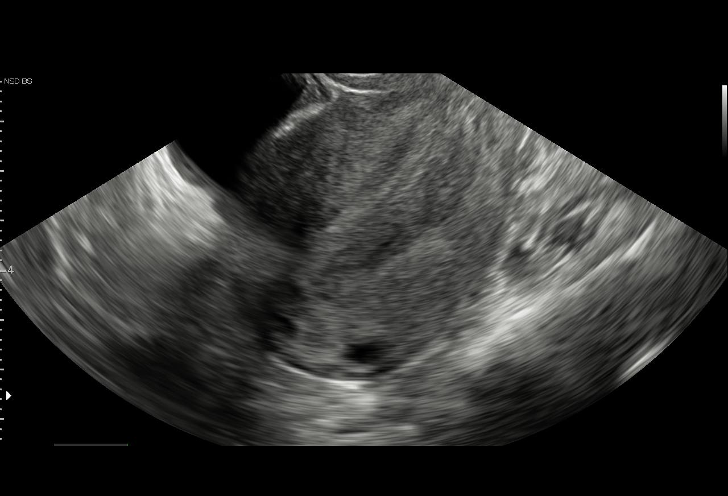
[im 15/25]
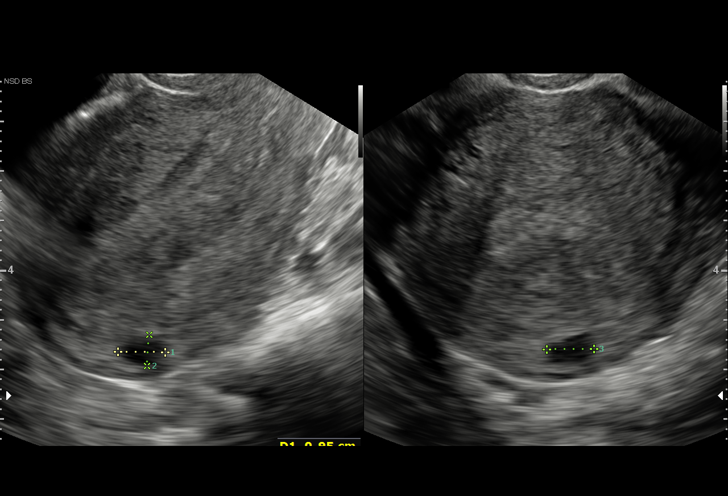
[im 16/25]
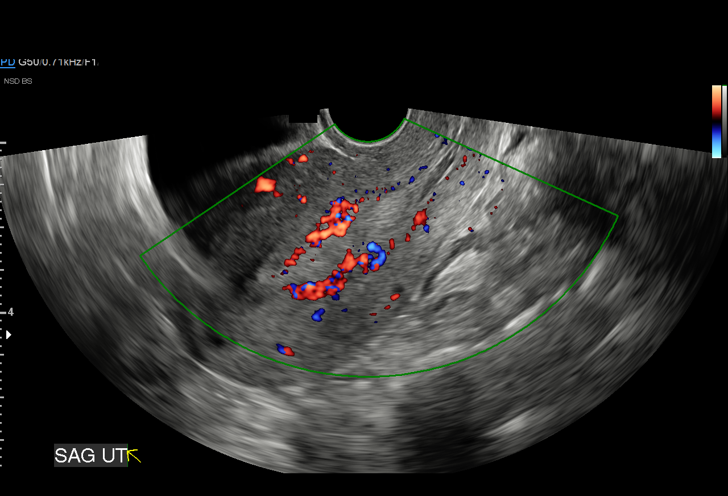
[im 18/25]
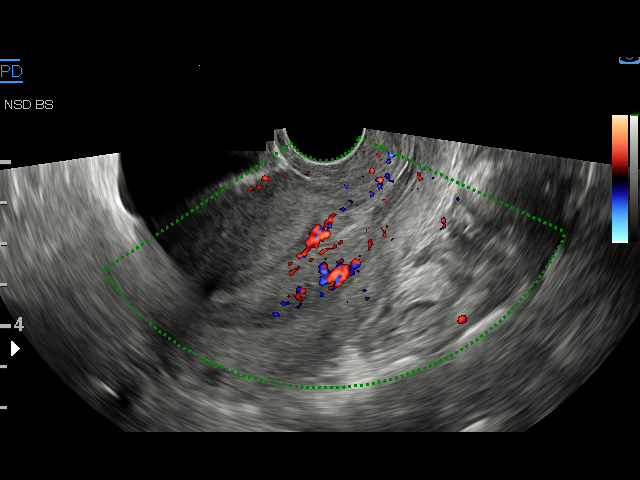
[im 20/25]
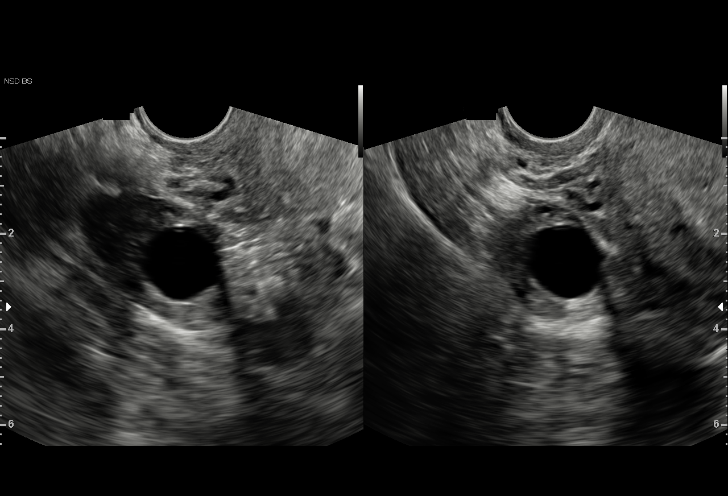
[im 21/25]
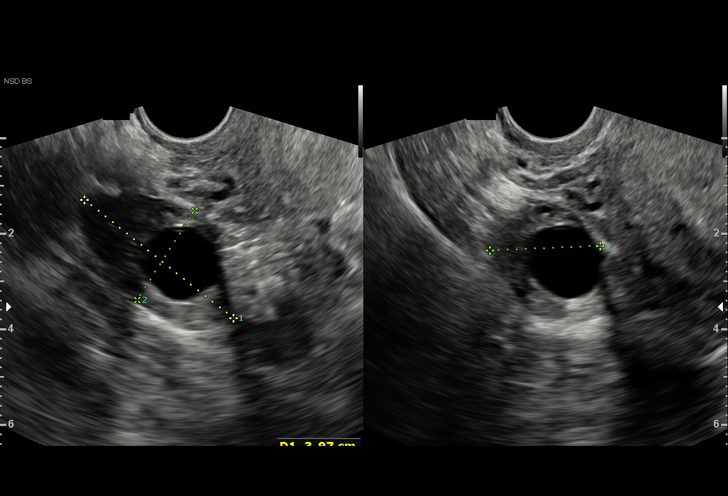
[im 23/25]
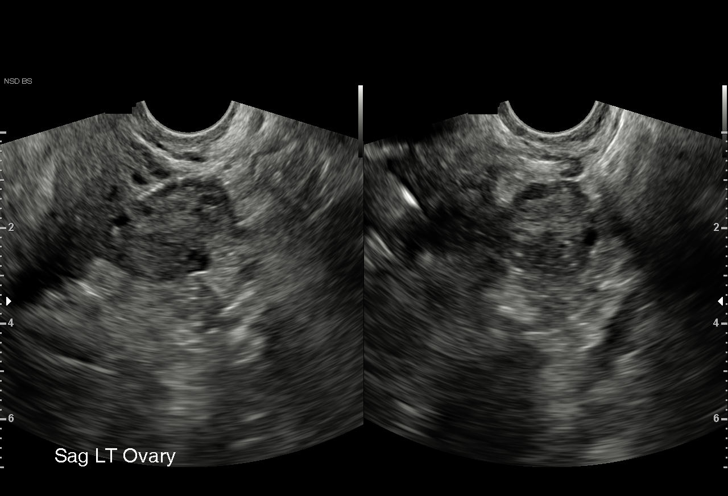
[im 25/25]
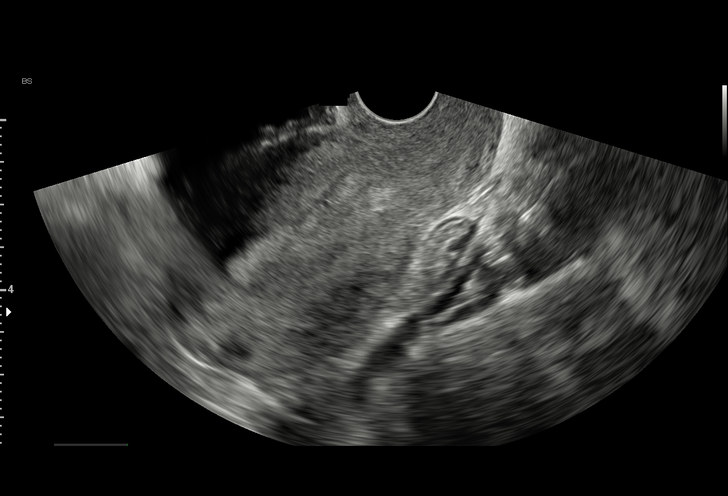

[15 of 25 positions shown; findings below may reference images not displayed]

FINDINGS: Intrauterine gestational sac: None

Yolk sac:  Not Visualized.

Embryo:  Not Visualized.

Cardiac Activity: Not Visualized.

Maternal uterus/adnexae: Absence of the previously seen gestational
sac. The endometrium measures up to 18 mm in double stripe
endometrial thickness with some demonstrable color Doppler
vascularity. There was demonstrable expulsion of tissue from the
vagina/cervix at the time of examination. Anteverted uterus. Small
posterior hypoechoic focus along the uterine fundus measuring 1 x
0.6 x 1 cm in size. Could reflect a prominent vascular channel or
tiny fibroid. Trace free fluid in the pelvis is nonspecific. Ovaries
are unremarkable. Corpus luteum noted in the right ovary.
IMPRESSION: Absence of the previously seen gestational sac. Findings meet
definitive criteria for failed pregnancy. This follows SRU consensus
guidelines: Diagnostic Criteria for Nonviable Pregnancy Early in the
First Trimester. N Engl J Med 5151;[DATE].

Persistent endometrial thickening and vascularity. The sonographic
findings are compatible with RPOC in the appropriate clinical
setting.

## 2022-09-16 ENCOUNTER — Encounter: Payer: Self-pay | Admitting: Advanced Practice Midwife

## 2022-09-16 ENCOUNTER — Ambulatory Visit (INDEPENDENT_AMBULATORY_CARE_PROVIDER_SITE_OTHER): Payer: Medicaid Other | Admitting: Advanced Practice Midwife

## 2022-09-16 ENCOUNTER — Other Ambulatory Visit (HOSPITAL_COMMUNITY)
Admission: RE | Admit: 2022-09-16 | Discharge: 2022-09-16 | Disposition: A | Discharge status: Home / Self Care | Payer: Medicaid Other | Source: Ambulatory Visit | Attending: Advanced Practice Midwife | Admitting: Advanced Practice Midwife

## 2022-09-16 VITALS — BP 95/62 | HR 80 | Ht 65.0 in | Wt 131.0 lb

## 2022-09-16 DIAGNOSIS — Z01419 Encounter for gynecological examination (general) (routine) without abnormal findings: Secondary | ICD-10-CM | POA: Insufficient documentation

## 2022-09-16 NOTE — Patient Instructions (Signed)
Try the generic of Colace 'stool softener'; 1-2 each day as needed to keep your bowel movements easier to pass

## 2022-09-16 NOTE — Progress Notes (Signed)
WELL-WOMAN EXAMINATION- Spanish interpreter Patient name: Daisy Ford MRN [id number]  Date of birth: 04/21/1990 Chief Complaint:   Gynecologic Exam (Pap/physcial, check iud. Last pap 07-03-19 normal)  History of Present Illness:   Daisy Ford is a 32 y.o. [phone] Hispanic female being seen today for a routine well-woman exam.  Current complaints: doing well w IUD; denies vag bldg  PCP: none      does not desire labs No LMP recorded. (Menstrual status: Lactating). The current method of family planning is IUD.  Last pap April 2021. Results were: NILM w/ HRHPV negative. H/O abnormal pap: no Last mammogram: never. Results were: N/A. Family h/o breast cancer: no Last colonoscopy: never. Results were: N/A. Family h/o colorectal cancer: no     09/16/2022    1:43 PM 01/15/2021    2:57 PM 07/03/2019    2:35 PM  Depression screen PHQ 2/9  Decreased Interest 1 0 0  Down, Depressed, Hopeless 0 0 0  PHQ - 2 Score 1 0 0  Altered sleeping 0 1 0  Tired, decreased energy 1 1 1   Change in appetite 0 0 0  Feeling bad or failure about yourself  0 0 0  Trouble concentrating 0 0 0  Moving slowly or fidgety/restless 0 0 0  Suicidal thoughts 0 0 0  PHQ-9 Score 2 2 1         09/16/2022    1:43 PM 01/15/2021    2:57 PM 07/03/2019    2:35 PM  GAD 7 : Generalized Anxiety Score  Nervous, Anxious, on Edge 0 0 0  Control/stop worrying 0 0 0  Worry too much - different things 0 0 0  Trouble relaxing 0 0 0  Restless 0 0 0  Easily annoyed or irritable 1 0 0  Afraid - awful might happen 0 0 0  Total GAD 7 Score 1 0 0     Review of Systems:   Pertinent items are noted in HPI Denies any headaches, blurred vision, fatigue, shortness of breath, chest pain, abdominal pain, abnormal vaginal discharge/itching/odor/irritation, problems with periods, bowel movements, urination, or intercourse unless otherwise stated above. Pertinent History Reviewed:  Reviewed past medical,surgical,  social and family history.  Reviewed problem list, medications and allergies. Physical Assessment:   Vitals:   09/16/22 1335  BP: 95/62  Pulse: 80  Weight: 131 lb (59.4 kg)  Height: 5\' 5"  (1.651 m)  Body mass index is 21.8 kg/m.        Physical Examination:   General appearance - well appearing, and in no distress  Mental status - alert, oriented to person, place, and time  Psych:  She has a normal mood and affect  Skin - warm and dry, normal color, no suspicious lesions noted  Chest - effort normal, all lung fields clear to auscultation bilaterally  Heart - normal rate and regular rhythm  Neck:  midline trachea, no thyromegaly or nodules  Breasts - breasts appear normal, no suspicious masses, no skin or nipple changes or  axillary nodes  Abdomen - soft, nontender, nondistended, no masses or organomegaly  Pelvic - VULVA: normal appearing vulva with no masses, tenderness or lesions  VAGINA: normal appearing vagina with normal color and discharge, no lesions  CERVIX: normal appearing cervix without discharge or lesions, no CMT; strings visible ~2-3cm from os  Thin prep pap is done with HR HPV cotesting  UTERUS: uterus is felt to be normal size, shape, consistency and nontender   ADNEXA: No adnexal  masses or tenderness noted.  Rectal - not examined  Extremities:  No swelling or varicosities noted  Chaperone: Peggy Dones    No results found for this or any previous visit (from the past 24 hour(s)).  Assessment & Plan:  1) Well-Woman Exam   Labs/procedures today: Pap  Mammogram: @ 31yo, or sooner if problems Colonoscopy: @ 31yo, or sooner if problems  No orders of the defined types were placed in this encounter.   Meds: No orders of the defined types were placed in this encounter.   Follow-up: Return in about 1 year (around 09/16/2023) for Physical.  Arabella Merles CNM 09/16/2022 2:06 PM

## 2022-09-21 LAB — CYTOLOGY - PAP
Chlamydia: NEGATIVE
Comment: NEGATIVE
Comment: NEGATIVE
Comment: NORMAL
Diagnosis: NEGATIVE
High risk HPV: NEGATIVE
Neisseria Gonorrhea: NEGATIVE

## 2022-10-06 ENCOUNTER — Encounter: Payer: Self-pay | Admitting: Women's Health

## 2022-10-08 ENCOUNTER — Ambulatory Visit (INDEPENDENT_AMBULATORY_CARE_PROVIDER_SITE_OTHER): Payer: Medicaid Other | Admitting: Adult Health

## 2022-10-08 ENCOUNTER — Encounter: Payer: Self-pay | Admitting: Adult Health

## 2022-10-08 VITALS — BP 111/62 | HR 99 | Ht 65.0 in | Wt 130.0 lb

## 2022-10-08 DIAGNOSIS — R1031 Right lower quadrant pain: Secondary | ICD-10-CM | POA: Diagnosis not present

## 2022-10-08 DIAGNOSIS — Z30431 Encounter for routine checking of intrauterine contraceptive device: Secondary | ICD-10-CM | POA: Diagnosis not present

## 2022-10-08 NOTE — Progress Notes (Signed)
  Subjective:     Patient ID: Daisy Ford, female   DOB: 04/12/1990, 32 y.o.   MRN: [id number]  HPI Daisy Ford is a 32 year old Hispanic female,married, [phone], in for IUD check, can't feel her strings and has pain RLQ on and off and more of a mucous like discharge, no itching or burning, and is not having a period with IUD. She has interpreter with her.     Component Value Date/Time   DIAGPAP  09/16/2022 1350    - Negative for intraepithelial lesion or malignancy (NILM)   DIAGPAP  07/03/2019 1439    - Negative for intraepithelial lesion or malignancy (NILM)   HPVHIGH Negative 09/16/2022 1350   HPVHIGH Negative 07/03/2019 1439   ADEQPAP  09/16/2022 1350    Satisfactory for evaluation; transformation zone component PRESENT.   ADEQPAP  07/03/2019 1439    Satisfactory for evaluation; transformation zone component PRESENT.     Review of Systems Can't feel IUD string Has pain RLQ on and off that radiates to back Has mucous discharge, no itching or burning No period with IUD Reviewed past medical,surgical, social and family history. Reviewed medications and allergies.     Objective:   Physical Exam BP 111/62 (BP Location: Left Arm, Patient Position: Sitting, Cuff Size: Normal)   Pulse 99   Ht 5\' 5"  (1.651 m)   Wt 130 lb (59 kg)   Breastfeeding No   BMI 21.63 kg/m     Skin warm and dry.Pelvic: external genitalia is normal in appearance no lesions, vagina: clear mucous discharge, no odor,urethra has no lesions or masses noted, cervix:bulbous, +IUD strings at os, they are short,uterus: normal size, shape and contour, non tender, no masses felt, adnexa: no masses or tenderness noted. Bladder is non tender and no masses felt.  Fall risk is low  Upstream - 10/08/22 0936       Pregnancy Intention Screening   Does the patient want to become pregnant in the next year? No    Does the patient's partner want to become pregnant in the next year? No    Would the patient like to  discuss contraceptive options today? No      Contraception Wrap Up   Current Method IUD or IUS    End Method IUD or IUS    Contraception Counseling Provided No            Examination chaperoned by Malachy Mood LPN  Assessment:     1. IUD check up +IUD strings at os Had mirena inserted post delivery 07/30/21  2. RLQ abdominal pain Has RLQ pain that comes and goes and radiates to back  Discussed could be break through ovulation, no pain today    Plan:     Follow up prn

## 2022-10-27 ENCOUNTER — Ambulatory Visit: Payer: Medicaid Other | Admitting: Family Medicine

## 2023-01-28 ENCOUNTER — Ambulatory Visit (INDEPENDENT_AMBULATORY_CARE_PROVIDER_SITE_OTHER): Payer: Medicaid Other | Admitting: Adult Health

## 2023-01-28 ENCOUNTER — Encounter: Payer: Self-pay | Admitting: Adult Health

## 2023-01-28 VITALS — BP 104/57 | HR 84 | Ht 65.0 in | Wt 134.0 lb

## 2023-01-28 DIAGNOSIS — Z975 Presence of (intrauterine) contraceptive device: Secondary | ICD-10-CM | POA: Diagnosis not present

## 2023-01-28 DIAGNOSIS — N926 Irregular menstruation, unspecified: Secondary | ICD-10-CM | POA: Insufficient documentation

## 2023-01-28 NOTE — Progress Notes (Signed)
  Subjective:     Patient ID: Daisy Ford, female   DOB: [birth year], 32 y.o.   MRN: [id number]  HPI Daisy Ford is a 32 year old Hispanic female,married, [id number], in complaining of having irregular bleeding with IUD, it is light but has has had twice this month, with some mild cramps. She has interpreter with her.     Component Value Date/Time   DIAGPAP  09/16/2022 1350    - Negative for intraepithelial lesion or malignancy (NILM)   DIAGPAP  07/03/2019 1439    - Negative for intraepithelial lesion or malignancy (NILM)   HPVHIGH Negative 09/16/2022 1350   HPVHIGH Negative 07/03/2019 1439   ADEQPAP  09/16/2022 1350    Satisfactory for evaluation; transformation zone component PRESENT.   ADEQPAP  07/03/2019 1439    Satisfactory for evaluation; transformation zone component PRESENT.    Review of Systems +irregular bleeding with IUD, it is light but has has had twice this month,  + mild cramps Denies any discharge or odor, no problems with bowel movement or urination No pain with sex Reviewed past medical,surgical, social and family history. Reviewed medications and allergies.     Objective:   Physical Exam BP (!) 104/57 (BP Location: Left Arm, Patient Position: Sitting, Cuff Size: Normal)   Pulse 84   Ht 5\' 5"  (1.651 m)   Wt 134 lb (60.8 kg)   Breastfeeding No   BMI 22.30 kg/m     Skin warm and dry.Pelvic: external genitalia is normal in appearance no lesions, vagina: +light blood, no odor,urethra has no lesions or masses noted, cervix:smooth and bulbous, +IUD strings at os, uterus: normal size, shape and contour, non tender, no masses felt, adnexa: no masses or tenderness noted. Bladder is non tender and no masses felt.  Upstream - 01/28/23 1011       Pregnancy Intention Screening   Does the patient want to become pregnant in the next year? No    Does the patient's partner want to become pregnant in the next year? No    Would the patient like to discuss contraceptive  options today? No      Contraception Wrap Up   Current Method IUD or IUS    End Method IUD or IUS    Contraception Counseling Provided No            Examination chaperoned by Faith Rogue LPN   Assessment:     1. Irregular bleeding Will follow for now, offered megace and she declined  2. IUD (intrauterine device) in place Mirena placed post delivery 07/30/21    Plan:     Follow up prn

## 2023-05-27 NOTE — Progress Notes (Unsigned)
 New Patient Office Visit  Subjective   Patient ID: Daisy Ford, female    DOB: May 27, 1990  Age: 33 y.o. MRN: [id number]  CC: No chief complaint on file.   HPI Daisy Ford presents to establish care ***  PMH: ***  PSH: ***  FH: ***  Tobacco use: *** Alcohol use: *** Drug use: *** Marital status: *** Employment: *** Sexual hx: ***  Screenings:  Colon Cancer: *** Lung Cancer: *** Breast Cancer: *** Diabetes: *** HLD: ***   Outpatient Encounter Medications as of 05/28/2023  Medication Sig   levonorgestrel (MIRENA) 20 MCG/DAY IUD 1 each by Intrauterine route once.   No facility-administered encounter medications on file as of 05/28/2023.    Past Medical History:  Diagnosis Date   GERD (gastroesophageal reflux disease)    Miscarriage     Past Surgical History:  Procedure Laterality Date   APPENDECTOMY      Family History  Problem Relation Age of Onset   Cancer Paternal Grandfather        prostate    Social History   Socioeconomic History   Marital status: Married    Spouse name: Not on file   Number of children: Not on file   Years of education: Not on file   Highest education level: Not on file  Occupational History   Not on file  Tobacco Use   Smoking status: Never   Smokeless tobacco: Never  Vaping Use   Vaping status: Never Used  Substance and Sexual Activity   Alcohol use: No   Drug use: No   Sexual activity: Yes    Birth control/protection: I.U.D.  Other Topics Concern   Not on file  Social History Narrative   Not on file   Social Drivers of Health   Financial Resource Strain: Low Risk  (09/16/2022)   Overall Financial Resource Strain (CARDIA)    Difficulty of Paying Living Expenses: Not hard at all  Food Insecurity: No Food Insecurity (09/16/2022)   Hunger Vital Sign    Worried About Running Out of Food in the Last Year: Never true    Ran Out of Food in the Last Year: Never true  Transportation Needs:  No Transportation Needs (09/16/2022)   PRAPARE - Administrator, Civil Service (Medical): No    Lack of Transportation (Non-Medical): No  Physical Activity: Insufficiently Active (09/16/2022)   Exercise Vital Sign    Days of Exercise per Week: 2 days    Minutes of Exercise per Session: 30 min  Stress: No Stress Concern Present (09/16/2022)   [employer] of Occupational Health - Occupational Stress Questionnaire    Feeling of Stress : Not at all  Social Connections: Moderately Integrated (09/16/2022)   Social Connection and Isolation Panel [NHANES]    Frequency of Communication with Friends and Family: More than three times a week    Frequency of Social Gatherings with Friends and Family: Once a week    Attends Religious Services: More than 4 times per year    Active Member of [employer] or Organizations: No    Attends Banker Meetings: Never    Marital Status: Married  Catering manager Violence: Not At Risk (09/16/2022)   Humiliation, Afraid, Rape, and Kick questionnaire    Fear of Current or Ex-Partner: No    Emotionally Abused: No    Physically Abused: No    Sexually Abused: No    ROS     Objective   There were  no vitals taken for this visit.  Physical Exam     Assessment & Plan:   There are no diagnoses linked to this encounter.  No follow-ups on file.   Sandre Kitty, MD

## 2023-05-28 ENCOUNTER — Ambulatory Visit (INDEPENDENT_AMBULATORY_CARE_PROVIDER_SITE_OTHER): Admitting: Family Medicine

## 2023-05-28 ENCOUNTER — Ambulatory Visit: Attending: Family Medicine

## 2023-05-28 ENCOUNTER — Encounter: Payer: Self-pay | Admitting: Family Medicine

## 2023-05-28 VITALS — BP 106/66 | HR 82 | Ht 65.0 in | Wt 137.0 lb

## 2023-05-28 DIAGNOSIS — Z7689 Persons encountering health services in other specified circumstances: Secondary | ICD-10-CM | POA: Diagnosis not present

## 2023-05-28 DIAGNOSIS — R002 Palpitations: Secondary | ICD-10-CM

## 2023-05-28 DIAGNOSIS — R232 Flushing: Secondary | ICD-10-CM | POA: Insufficient documentation

## 2023-05-28 NOTE — Assessment & Plan Note (Signed)
 Possibly panic episodes but the last less than a minute at a time.  Will check metanephrines and TSH in addition to routine blood work.

## 2023-05-28 NOTE — Patient Instructions (Addendum)
 It was nice to see you today,  We addressed the following topics today: -I am going to order some blood tests and we will send you a message about the results we will discuss them with you at your next visit - I am going to order a heart monitor for you.  This will be sent to you and you will put it on your self and then return it in the mail when you are done.  If you have any questions about it please let us know - If you have any episodes of passing out please let us know prior to your next visit   Have a great day,  Frederic Jericho, MD    Fue un placer verte hoy.  Hoy abordamos los siguientes temas: - Voy a Radio broadcast assistant unos anlisis de sangre y te enviaremos un mensaje con los resultados; los comentaremos contigo en tu prxima visita. - Voy a solicitar un monitor cardaco. Te lo enviaremos, te lo colocars t mismo y lo devolvers por correo cuando termines. Si tienes alguna pregunta al respecto, por favor, hznoslo saber. - Si tienes algn episodio de desmayo, por favor, avsanos antes de tu prxima visita.  Que tengas un buen da,  Frederic Jericho, MD

## 2023-05-28 NOTE — Progress Notes (Unsigned)
 EP to read.

## 2023-05-28 NOTE — Assessment & Plan Note (Signed)
 Episodes only last a few seconds.  More prominent over the past 4 months.  Occur daily.  Shortness of breath with the episodes but not afterwards.  EKG normal today.  Will do Zio patch will.  After results of Zio patch we will likely also get an echo given the 1/6 systolic murmur on exam.

## 2023-06-01 ENCOUNTER — Ambulatory Visit: Payer: Medicaid Other | Admitting: Family Medicine

## 2023-06-02 ENCOUNTER — Encounter: Payer: Self-pay | Admitting: Family Medicine

## 2023-06-02 LAB — CBC WITH DIFFERENTIAL/PLATELET
Basophils Absolute: 0 10*3/uL (ref 0.0–0.2)
Basos: 0 %
EOS (ABSOLUTE): 0.1 10*3/uL (ref 0.0–0.4)
Eos: 1 %
Hematocrit: 44.5 % (ref 34.0–46.6)
Hemoglobin: 15 g/dL (ref 11.1–15.9)
Immature Grans (Abs): 0 10*3/uL (ref 0.0–0.1)
Immature Granulocytes: 0 %
Lymphocytes Absolute: 1.5 10*3/uL (ref 0.7–3.1)
Lymphs: 24 %
MCH: 31.6 pg (ref 26.6–33.0)
MCHC: 33.7 g/dL (ref 31.5–35.7)
MCV: 94 fL (ref 79–97)
Monocytes Absolute: 0.4 10*3/uL (ref 0.1–0.9)
Monocytes: 7 %
Neutrophils Absolute: 4.2 10*3/uL (ref 1.4–7.0)
Neutrophils: 68 %
Platelets: 338 10*3/uL (ref 150–450)
RBC: 4.75 x10E6/uL (ref 3.77–5.28)
RDW: 11.8 % (ref 11.7–15.4)
WBC: 6.2 10*3/uL (ref 3.4–10.8)

## 2023-06-02 LAB — COMPREHENSIVE METABOLIC PANEL
ALT: 15 IU/L (ref 0–32)
AST: 17 IU/L (ref 0–40)
Albumin: 4.7 g/dL (ref 3.9–4.9)
Alkaline Phosphatase: 53 IU/L (ref 44–121)
BUN/Creatinine Ratio: 27 — ABNORMAL HIGH (ref 9–23)
BUN: 17 mg/dL (ref 6–20)
Bilirubin Total: 0.4 mg/dL (ref 0.0–1.2)
CO2: 26 mmol/L (ref 20–29)
Calcium: 9.8 mg/dL (ref 8.7–10.2)
Chloride: 102 mmol/L (ref 96–106)
Creatinine, Ser: 0.64 mg/dL (ref 0.57–1.00)
Globulin, Total: 2.8 g/dL (ref 1.5–4.5)
Glucose: 71 mg/dL (ref 70–99)
Potassium: 4 mmol/L (ref 3.5–5.2)
Sodium: 140 mmol/L (ref 134–144)
Total Protein: 7.5 g/dL (ref 6.0–8.5)
eGFR: 121 mL/min/{1.73_m2} (ref >59)

## 2023-06-02 LAB — IRON,TIBC AND FERRITIN PANEL
Ferritin: 75 ng/mL (ref 15–150)
Iron Saturation: 40 % (ref 15–55)
Iron: 126 ug/dL (ref 27–159)
Total Iron Binding Capacity: 313 ug/dL (ref 250–450)
UIBC: 187 ug/dL (ref 131–425)

## 2023-06-02 LAB — TSH: TSH: 1.97 u[IU]/mL (ref 0.450–4.500)

## 2023-06-02 LAB — METANEPHRINES, PLASMA: Normetanephrine, Free: 58.5 pg/mL (ref 0.0–210.1)

## 2023-06-02 LAB — LIPID PANEL
Chol/HDL Ratio: 2.2 ratio (ref 0.0–4.4)
Cholesterol, Total: 132 mg/dL (ref 100–199)
HDL: 59 mg/dL (ref >39)
LDL Chol Calc (NIH): 60 mg/dL (ref 0–99)
Triglycerides: 61 mg/dL (ref 0–149)
VLDL Cholesterol Cal: 13 mg/dL (ref 5–40)

## 2023-06-02 LAB — VITAMIN D 25 HYDROXY (VIT D DEFICIENCY, FRACTURES): Vit D, 25-Hydroxy: 24.8 ng/mL — ABNORMAL LOW (ref 30.0–100.0)

## 2023-06-02 LAB — HEMOGLOBIN A1C
Est. average glucose Bld gHb Est-mCnc: 114 mg/dL
Hgb A1c MFr Bld: 5.6 % (ref 4.8–5.6)

## 2023-06-03 ENCOUNTER — Other Ambulatory Visit: Payer: Self-pay | Admitting: Family Medicine

## 2023-06-03 DIAGNOSIS — R002 Palpitations: Secondary | ICD-10-CM

## 2023-06-03 DIAGNOSIS — R232 Flushing: Secondary | ICD-10-CM

## 2023-06-26 DIAGNOSIS — R002 Palpitations: Secondary | ICD-10-CM | POA: Diagnosis not present

## 2023-06-27 ENCOUNTER — Encounter: Payer: Self-pay | Admitting: Family Medicine

## 2023-07-28 ENCOUNTER — Ambulatory Visit (INDEPENDENT_AMBULATORY_CARE_PROVIDER_SITE_OTHER): Payer: Medicaid Other | Admitting: Dermatology

## 2023-07-28 ENCOUNTER — Encounter: Payer: Self-pay | Admitting: Dermatology

## 2023-07-28 VITALS — BP 97/62 | HR 81

## 2023-07-28 DIAGNOSIS — L649 Androgenic alopecia, unspecified: Secondary | ICD-10-CM

## 2023-07-28 MED ORDER — SAFETY SEAL MISCELLANEOUS MISC: 1.0000 | Freq: Every morning | 9 refills | Status: DC

## 2023-07-28 NOTE — Progress Notes (Signed)
 New Patient Visit   Subjective  Daisy Ford is a 33 y.o. female accompanied by interpreter Angelique who presents for the following: Hair thinning and hair loss  Patient states she has hair thinning and hair loss located at the scalp that she would like to have examined. Patient reports the areas have been there for 3 years. She reports the areas are not bothersome.Patient reports she has not previously been treated for these areas. Patient reports family hx of hair thinning on father's side.   The following portions of the chart were reviewed this encounter and updated as appropriate: medications, allergies, medical history  Review of Systems:  No other skin or systemic complaints except as noted in HPI or Assessment and Plan.  Objective  Well appearing patient in no apparent distress; mood and affect are within normal limits.  A focused examination was performed of the following areas: Scalp  Relevant exam findings are noted in the Assessment and Plan.              Assessment & Plan   ANDROGENETIC ALOPECIA (FEMALE PATTERN HAIR LOSS) Exam: Diffuse thinning on B/L temples and widening of the midline part with retention of the frontal hairline  Flared  Patient Education Discussed During Visit: Female Androgenic Alopecia is a chronic condition related to genetics and/or hormonal changes.  In women androgenetic alopecia is commonly associated with menopause but may occur any time after puberty.  It causes hair thinning primarily on the crown with widening of the part and temporal hairline recession.  Can use OTC Rogaine (minoxidil) 5% solution/foam as directed.   - Assessment: Patient presents with persistent hair thinning that began after childbirth and has continued despite some improvement. Reports family history of hair thinning in her father. Pattern of hair loss consistent with androgenetic alopecia, likely exacerbated by hormonal changes associated with  pregnancy and childbirth. Patient has not tried any prescription or over-the-counter treatments for this condition.  - Plan:    Prescribe topical solution containing minoxidil and finasteride for daily morning application     - Patient education provided on consistent use and potential hair loss if discontinued for three months or more     - Advised to stop treatment if pregnant or breastfeeding     - Warned about potential unwanted facial hair growth if applied at night    Recommend optional supplements to enhance hair growth:     - Vital Proteins Collagen Powder     - Viviscal Capsules    Provide information about [facility] for prescription fulfillment     - Informed patient that pharmacy will contact for payment and delivery details    Patient education:     - Emphasized importance of consistent use of prescribed treatment     - Discussed potential side effects and precautions   Long term medication management.  Patient is using long term (months to years) prescription medication  to control their dermatologic condition.  These medications require periodic monitoring to evaluate for efficacy and side effects and may require periodic laboratory monitoring.   ANDROGENIC ALOPECIA   Related Medications Safety Seal Miscellaneous MISC Apply 1 Application topically in the morning. Medication Name: Hormonic Hair Solution Minoxidil 7% Finasteride 0.05%  Return in about 4 months (around 11/28/2023) for Androgenetic Alopecia F/U.  I, Jetta Ager, am acting as Neurosurgeon for [employer], DO.  Documentation: I have reviewed the above documentation for accuracy and completeness, and I agree with the above.  Louana Roup,  DO

## 2023-07-28 NOTE — Patient Instructions (Addendum)
 Hola Mara Bentonia,  Gracias por su visita. A continuacin, un resumen de las instrucciones clave:  - Medicamentos: - Aplique la solucin tpica recetada todas las [employer] las zonas afectadas del cuero cabelludo. - No aplique la solucin por la noche para evitar el crecimiento de vello facial no deseado. - Suspenda el uso de la solucin si queda embarazada o comienza a Museum/gallery exhibitions officer.  Hola Mara Hidalgo,  Gracias por su visita. A continuacin, un resumen de las instrucciones clave:  Diagnosis: Androgenetic Alopecia  - Medicamentos: - Aplique la solucin tpica recetada todas las [address] en las zonas afectadas del cuero cabelludo. - No aplique la solucin por la noche para evitar el crecimiento de vello facial no deseado. - Suspenda el uso de la solucin si queda embarazada o comienza a Museum/gallery exhibitions officer.  - Suplementos (opcionales): - Colgeno en polvo Vital Proteins - Cpsulas Viviscal - Se pueden comprar en [facility] o [employer].  - Informacin de farmacia: - La farmacia [facility] le llamar para informarle sobre el pago y los detalles de envo. - Personal que habla espaol disponible.  - Notas importantes: - El crecimiento de cabello nuevo demora aproximadamente 4 meses. - Suspender el tratamiento durante 3 meses o ms puede provocar que la cada del cabello contine.  Esperamos verle en su prxima visita. Si tiene alguna pregunta o inquietud antes de esa fecha, no dude en comunicarse con nuestra oficina.  Atentamente,  Dra. Louana Roup Dermatologa - Colgeno en polvo Vital Proteins - Cpsulas Viviscal - Se pueden comprar en [facility] o [employer].  - Informacin de farmacia: - La farmacia [facility] le llamar para informarle sobre el pago y los detalles de envo. - Personal que habla espaol disponible.  - Notas importantes: - El crecimiento de cabello nuevo demora aproximadamente 4 meses. - Suspender el tratamiento durante 3 meses o ms puede provocar que la cada del cabello  contine.  Esperamos verle en su prxima visita. Si tiene alguna pregunta o inquietud antes de esa fecha, no dude en comunicarse con nuestra oficina.  Atentamente,  DraLouana Roup Dermatologa     Date: Wed Jul 28 2023  Hello Arlon Bergamo,  Thank you for visiting today. Here is a summary of the key instructions:  - Medications:   - Apply prescribed topical solution every morning to affected areas of scalp   - Do not apply solution at night to avoid unwanted facial hair growth   - Stop using the solution if you become pregnant or start breastfeeding  - Supplements (optional):   - Vital Proteins Collagen Powder   - Viviscal Capsules   - These can be purchased at [employer] or [employer]  - Pharmacy Information:   [facility] will call you for payment and mailing details   - Spanish-speaking staff available  - Important Notes:   - It takes about 4 months to see new hair growth   - Stopping treatment for 3 months or more will cause hair loss to continue  We look forward to seeing you at your next visit. If you have any questions or concerns before then, please do not hesitate to contact our office.  Warm regards,  Dr. Louana Roup Dermatology             Important Information   Due to recent changes in healthcare laws, you may see results of your pathology and/or laboratory studies on MyChart before the doctors have had a chance to review them. We understand that in some cases there may be  results that are confusing or concerning to you. Please understand that not all results are received at the same time and often the doctors may need to interpret multiple results in order to provide you with the best plan of care or course of treatment. Therefore, we ask that you please give us  2 business days to thoroughly review all your results before contacting the office for clarification. Should we see a critical lab result, you will be contacted sooner.     If You  Need Anything After Your Visit   If you have any questions or concerns for your doctor, please call our main line at [phone]. If no one answers, please leave a voicemail as directed and we will return your call as soon as possible. Messages left after 4 pm will be answered the following business day.    You may also send us  a message via MyChart. We typically respond to MyChart messages within 1-2 business days.  For prescription refills, please ask your pharmacy to contact our office. Our fax number is [phone].  If you have an urgent issue when the clinic is closed that cannot wait until the next business day, you can page your doctor at the number below.     Please note that while we do our best to be available for urgent issues outside of office hours, we are not available 24/7.    If you have an urgent issue and are unable to reach us , you may choose to seek medical care at your doctor's office, retail clinic, urgent care center, or emergency room.   If you have a medical emergency, please immediately call 911 or go to the emergency department. In the event of inclement weather, please call our main line at [phone] for an update on the status of any delays or closures.  Dermatology Medication Tips: Please keep the boxes that topical medications come in in order to help keep track of the instructions about where and how to use these. Pharmacies typically print the medication instructions only on the boxes and not directly on the medication tubes.   If your medication is too expensive, please contact our office at [phone] or send us  a message through MyChart.    We are unable to tell what your co-pay for medications will be in advance as this is different depending on your insurance coverage. However, we may be able to find a substitute medication at lower cost or fill out paperwork to get insurance to cover a needed medication.    If a prior authorization is required to  get your medication covered by your insurance company, please allow us  1-2 business days to complete this process.   Drug prices often vary depending on where the prescription is filled and some pharmacies may offer cheaper prices.   The website www.goodrx.com contains coupons for medications through different pharmacies. The prices here do not account for what the cost may be with help from insurance (it may be cheaper with your insurance), but the website can give you the price if you did not use any insurance.  - You can print the associated coupon and take it with your prescription to the pharmacy.  - You may also stop by our office during regular business hours and pick up a GoodRx coupon card.  - If you need your prescription sent electronically to a different pharmacy, notify our office through [facility] or by phone at [phone]

## 2023-07-29 ENCOUNTER — Ambulatory Visit (INDEPENDENT_AMBULATORY_CARE_PROVIDER_SITE_OTHER): Admitting: Family Medicine

## 2023-07-29 ENCOUNTER — Encounter: Payer: Self-pay | Admitting: Family Medicine

## 2023-07-29 VITALS — Ht 65.0 in | Wt 139.0 lb

## 2023-07-29 DIAGNOSIS — R002 Palpitations: Secondary | ICD-10-CM | POA: Diagnosis not present

## 2023-07-29 DIAGNOSIS — I951 Orthostatic hypotension: Secondary | ICD-10-CM

## 2023-07-29 DIAGNOSIS — R7989 Other specified abnormal findings of blood chemistry: Secondary | ICD-10-CM

## 2023-07-29 NOTE — Progress Notes (Unsigned)
   Established Patient Office Visit  Subjective   Patient ID: Daisy Ford, female    DOB: Aug 20, 1990  Age: 33 y.o. MRN: [id number]  Chief Complaint  Patient presents with   Medical Management of Chronic Issues    HPI  Subjective - Dizziness: Reports black vision when bending over and standing back up, occurring less than 50% of the time - Symptoms began 5-6 months ago - Experiences light headache and mild dizziness after episodes - No syncope or falls - Palpitations: Recently returned but less frequent than before - Hot flashes: Less continuous now, come and go  Medications None mentioned  PMH, PSH, FH, Social Hx Low vitamin D  noted on previous labs  ROS Menstrual: Regular periods with light flow   The ASCVD Risk score (Arnett DK, et al., 2019) failed to calculate for the following reasons:   The 2019 ASCVD risk score is only valid for ages 23 to 29  Health Maintenance Due  Topic Date Due   COVID-19 Vaccine (1 - 2024-25 season) Never done      Objective:     Ht 5\' 5"  (1.651 m)   Wt 139 lb (63 kg)   LMP 06/08/2023   SpO2 97%   BMI 23.13 kg/m  {Vitals History (Optional):23777}  Physical Exam Gen: alert, oriented Heent: perrla, eomi Cv: rrr Pulm: lctab   No results found for any visits on 07/29/23.      Assessment & Plan:   Orthostasis -     Cortisol; Future -     Comprehensive metabolic panel with GFR; Future -     CBC; Future     Return in about 6 months (around 01/29/2024) for physical.    Laneta Pintos, MD

## 2023-07-29 NOTE — Patient Instructions (Addendum)
 It was nice to see you today,  We addressed the following topics today: -Your dizziness is caused by something called orthostatic hypotension.  I am going to get some test to look for possible causes of this. - To try to prevent this from happening at home, try standing up more slowly, and waiting a few seconds before you start to walk after standing up. - You should take 2000 to 4000 IU of vitamin D  daily.  Have a great day,  Etha Henle, MD

## 2023-08-04 DIAGNOSIS — I951 Orthostatic hypotension: Secondary | ICD-10-CM | POA: Insufficient documentation

## 2023-08-04 DIAGNOSIS — R7989 Other specified abnormal findings of blood chemistry: Secondary | ICD-10-CM | POA: Insufficient documentation

## 2023-08-04 NOTE — Assessment & Plan Note (Signed)
-   Symptoms occur <50% of time when changing positions - Orthostatic vital signs ordered - these were normal in the office.  No decrased bp or increase in HR - Likely due to low baseline blood pressure - Conservative measures recommended including standing up slowly and pausing before walking

## 2023-08-04 NOTE — Assessment & Plan Note (Signed)
-   Noted on previous labs as mildly low - Patient has vitamin D  supplement at home, advised to take daily

## 2023-08-04 NOTE — Assessment & Plan Note (Signed)
-   Recently returned but less frequent - Previous zio patch testing was normal - Intermittent symptoms - Continue monitoring

## 2023-08-05 ENCOUNTER — Other Ambulatory Visit

## 2023-08-05 DIAGNOSIS — I951 Orthostatic hypotension: Secondary | ICD-10-CM

## 2023-08-06 ENCOUNTER — Ambulatory Visit: Payer: Self-pay | Admitting: Family Medicine

## 2023-08-06 LAB — CBC
Hematocrit: 45.6 % (ref 34.0–46.6)
Hemoglobin: 14.9 g/dL (ref 11.1–15.9)
MCH: 31.9 pg (ref 26.6–33.0)
MCHC: 32.7 g/dL (ref 31.5–35.7)
MCV: 98 fL — ABNORMAL HIGH (ref 79–97)
Platelets: 311 10*3/uL (ref 150–450)
RBC: 4.67 x10E6/uL (ref 3.77–5.28)
RDW: 12.1 % (ref 11.7–15.4)
WBC: 6 10*3/uL (ref 3.4–10.8)

## 2023-08-06 LAB — COMPREHENSIVE METABOLIC PANEL WITH GFR
ALT: 17 IU/L (ref 0–32)
AST: 21 IU/L (ref 0–40)
Albumin: 4.6 g/dL (ref 3.9–4.9)
Alkaline Phosphatase: 67 IU/L (ref 44–121)
BUN/Creatinine Ratio: 24 — ABNORMAL HIGH (ref 9–23)
BUN: 15 mg/dL (ref 6–20)
Bilirubin Total: 0.4 mg/dL (ref 0.0–1.2)
CO2: 21 mmol/L (ref 20–29)
Calcium: 9.4 mg/dL (ref 8.7–10.2)
Chloride: 102 mmol/L (ref 96–106)
Creatinine, Ser: 0.63 mg/dL (ref 0.57–1.00)
Globulin, Total: 2.7 g/dL (ref 1.5–4.5)
Glucose: 63 mg/dL — ABNORMAL LOW (ref 70–99)
Potassium: 3.9 mmol/L (ref 3.5–5.2)
Sodium: 141 mmol/L (ref 134–144)
Total Protein: 7.3 g/dL (ref 6.0–8.5)
eGFR: 122 mL/min/{1.73_m2} (ref >59)

## 2023-08-06 LAB — CORTISOL: Cortisol: 14.3 ug/dL (ref 6.2–19.4)

## 2023-10-01 ENCOUNTER — Ambulatory Visit (INDEPENDENT_AMBULATORY_CARE_PROVIDER_SITE_OTHER): Admitting: Family Medicine

## 2023-10-01 ENCOUNTER — Encounter: Payer: Self-pay | Admitting: Family Medicine

## 2023-10-01 VITALS — BP 111/68 | HR 79 | Ht 65.0 in | Wt 144.8 lb

## 2023-10-01 DIAGNOSIS — N3001 Acute cystitis with hematuria: Secondary | ICD-10-CM | POA: Insufficient documentation

## 2023-10-01 LAB — POCT URINALYSIS DIP (CLINITEK)
Bilirubin, UA: NEGATIVE
Glucose, UA: NEGATIVE mg/dL
Ketones, POC UA: NEGATIVE mg/dL
Nitrite, UA: POSITIVE — AB
POC PROTEIN,UA: NEGATIVE
Spec Grav, UA: 1.02 (ref 1.010–1.025)
Urobilinogen, UA: 0.2 U/dL
pH, UA: 6.5 (ref 5.0–8.0)

## 2023-10-01 MED ORDER — SULFAMETHOXAZOLE-TRIMETHOPRIM 800-160 MG PO TABS: 1.0000 | ORAL_TABLET | Freq: Two times a day (BID) | ORAL | 0 refills | Status: AC

## 2023-10-01 NOTE — Assessment & Plan Note (Signed)
 Patient presents with a 3-day history of cloudy urine and strong odor. Denies dysuria, frequency, fever, or back pain. Urine test indicates an infection. - Sent urine for culture. - Prescribed antibiotic, to be taken twice a day for 3 days. - Advised to contact us  if symptoms do not improve after 3 days.

## 2023-10-01 NOTE — Progress Notes (Signed)
   Acute Office Visit  Subjective:     Patient ID: Daisy Ford, female    DOB: 1991/01/09, 33 y.o.   MRN: [id number]  Chief Complaint  Patient presents with   Urinary Tract Infection    HPI Patient is in today for    Subjective - Presents with cloudy urine and a strong odor, particularly in the mornings, for the last 3 days. - Reports no urinary frequency, burning with urination, back pain, fever, nausea, vaginal discharge, or itching. - States last UTI was over 5 years ago during pregnancy with first son. - Has tried over-the-counter Azo, which provided no relief.  Medications Over-the-counter Azo, no relief.  PMH, PSH, FH, Social Hx Last UTI over 5 years ago.  ROS - Constitutional: No fever. - Genitourinary: Reports cloudy urine with strong odor. Denies frequency, dysuria, vaginal discharge, or itching. - Musculoskeletal: Denies back pain. - Gastrointestinal: Denies nausea.   ROS      Objective:    BP 111/68   Pulse 79   Ht 5' 5 (1.651 m)   Wt 144 lb 12.8 oz (65.7 kg)   SpO2 97%   BMI 24.10 kg/m    Physical Exam Gen: alert, oriented Pulm: no resp distress Psych: pleasant affect  Results for orders placed or performed in visit on 10/01/23  POCT URINALYSIS DIP (CLINITEK)  Result Value Ref Range   Color, UA yellow yellow   Clarity, UA cloudy (A) clear   Glucose, UA negative negative mg/dL   Bilirubin, UA negative negative   Ketones, POC UA negative negative mg/dL   Spec Grav, UA 8.979 8.989 - 1.025   Blood, UA moderate (A) negative   pH, UA 6.5 5.0 - 8.0   POC PROTEIN,UA negative negative, trace   Urobilinogen, UA 0.2 0.2 or 1.0 E.U./dL   Nitrite, UA Positive (A) Negative   Leukocytes, UA Small (1+) (A) Negative        Assessment & Plan:   Acute cystitis with hematuria Assessment & Plan: Patient presents with a 3-day history of cloudy urine and strong odor. Denies dysuria, frequency, fever, or back pain. Urine test indicates an  infection. - Sent urine for culture. - Prescribed antibiotic, to be taken twice a day for 3 days. - Advised to contact us  if symptoms do not improve after 3 days.  Orders: -     Urine Culture -     POCT URINALYSIS DIP (CLINITEK)  Other orders -     Sulfamethoxazole-Trimethoprim; Take 1 tablet by mouth 2 (two) times daily for 3 days.  Dispense: 6 tablet; Refill: 0     Return if symptoms worsen or fail to improve.  Toribio MARLA Slain, MD

## 2023-10-01 NOTE — Patient Instructions (Addendum)
 It was nice to see you today,  We addressed the following topics today: -You have a UTI.  I have sent in an antibiotic for you to take for twice a day for the next 3 days.  If after 3 days your symptoms are not better please let us  know.   Have a great day,  Rolan Slain, MD    Fue un placer verte hoy.  Hoy abordamos los siguientes temas: -Tienes una infeccin urinaria. Te he recetado un antibitico para que lo tomes [employer] al da [employer] prximos 3 [place]. Si despus de 3 das tus sntomas no mejoran, por favor, avsanos.  Que tengas un Marine scientist.  Rolan Slain, MD

## 2023-10-05 ENCOUNTER — Ambulatory Visit: Payer: Self-pay

## 2023-10-05 LAB — URINE CULTURE

## 2023-11-29 ENCOUNTER — Ambulatory Visit: Admitting: Family Medicine

## 2023-11-30 ENCOUNTER — Ambulatory Visit (INDEPENDENT_AMBULATORY_CARE_PROVIDER_SITE_OTHER): Admitting: Family Medicine

## 2023-11-30 VITALS — BP 97/62 | HR 78 | Ht 65.0 in | Wt 142.0 lb

## 2023-11-30 DIAGNOSIS — N3001 Acute cystitis with hematuria: Secondary | ICD-10-CM | POA: Diagnosis not present

## 2023-11-30 LAB — POCT URINALYSIS DIP (CLINITEK)
Bilirubin, UA: NEGATIVE
Glucose, UA: NEGATIVE mg/dL
Ketones, POC UA: NEGATIVE mg/dL
Nitrite, UA: POSITIVE — AB
POC PROTEIN,UA: NEGATIVE
Spec Grav, UA: >=1.03 — AB (ref 1.010–1.025)
Urobilinogen, UA: 0.2 U/dL
pH, UA: 6 (ref 5.0–8.0)

## 2023-11-30 MED ORDER — CEPHALEXIN 500 MG PO CAPS: 500.0000 mg | ORAL_CAPSULE | Freq: Two times a day (BID) | ORAL | 0 refills | Status: AC

## 2023-11-30 NOTE — Patient Instructions (Addendum)
 It was nice to see you today,  We addressed the following topics today: -You have a UTI again. - I am sending in a prescription for Keflex  to take twice a day for the next 5 days. - If your symptoms do not get better after finishing the antibiotics let us  know - You have an appointment with me in November.  Have a great day,  Rolan Slain, MD  Fue un placer verte hoy.  Hoy abordamos los siguientes temas: -Tienes una infeccin urinaria de [place]. - Te enviar una receta de Keflex  para tomar dos veces al [employer] prximos 5 [place]. - Si tus sntomas no mejoran despus de terminar los antibiticos, avsanos. - Tienes una cita conmigo en noviembre.  Que tengas un buen da,  Dr. Rolan Slain

## 2023-11-30 NOTE — Progress Notes (Unsigned)
   Acute Office Visit  Subjective:     Patient ID: Daisy Ford, female    DOB: 11/06/1990, 33 y.o.   MRN: [id number]  Chief Complaint  Patient presents with   Urinary Tract Infection    HPI Patient is in today for   Subjective - Reports symptoms for 10 days. - Mild abdominal pain reported. No fever, chills, or nausea. - Self-treated with an over-the-counter product (Azo).  Medications Patient reports taking Bactrim  in July for a previous UTI, which was well-tolerated. Currently taking Azo.  PMH, PSH, FH, Social Hx PMHx: Recurrent urinary tract infections, with the last one approximately one to two months ago (treated in July).  ROS GU: Reports symptoms consistent with UTI. Denies fever, chills. GI: Mild abdominal pain. Denies nausea.   ROS      Objective:    BP 97/62   Pulse 78   Ht 5' 5 (1.651 m)   Wt 142 lb (64.4 kg)   SpO2 97%   BMI 23.63 kg/m  {Vitals History (Optional):23777}  Physical Exam Gen: alert, oriented Pulm: no respiratory distress Psych: pleasant affect  No results found for any visits on 11/30/23.      Assessment & Plan:   Acute cystitis with hematuria Assessment & Plan: The patient presents with a 10-day history of UTI symptoms. Urinalysis is positive for blood, nitrates, and leukocyte esterase. A previous UTI was treated with Bactrim  in July. The current symptoms may be due to incomplete eradication of the previous infection or a new infection. - A urine culture will be sent to guide therapy. - Prescribed Keflex  500 mg to be taken twice a day for five days. - Counseled on causes of recurrent UTIs, including dehydration and hygiene. - Advised to contact the office if symptoms do not improve after completing the antibiotic course. - Follow-up appointment scheduled for November.   Other orders -     Cephalexin ; Take 1 capsule (500 mg total) by mouth 2 (two) times daily for 5 days.  Dispense: 10 capsule; Refill:  0     Return for on file already in nov.  Toribio MARLA Slain, MD

## 2023-11-30 NOTE — Assessment & Plan Note (Signed)
 The patient presents with a 10-day history of UTI symptoms. Urinalysis is positive for blood, nitrates, and leukocyte esterase. A previous UTI was treated with Bactrim  in July. The current symptoms may be due to incomplete eradication of the previous infection or a new infection. - A urine culture will be sent to guide therapy. - Prescribed Keflex  500 mg to be taken twice a day for five days. - Counseled on causes of recurrent UTIs, including dehydration and hygiene. - Advised to contact the office if symptoms do not improve after completing the antibiotic course. - Follow-up appointment scheduled for November.

## 2023-12-03 ENCOUNTER — Ambulatory Visit: Payer: Self-pay | Admitting: Family Medicine

## 2023-12-03 LAB — URINE CULTURE

## 2023-12-06 ENCOUNTER — Ambulatory Visit: Admitting: Dermatology

## 2023-12-06 ENCOUNTER — Encounter: Payer: Self-pay | Admitting: Dermatology

## 2023-12-06 DIAGNOSIS — L649 Androgenic alopecia, unspecified: Secondary | ICD-10-CM | POA: Diagnosis not present

## 2023-12-06 MED ORDER — SAFETY SEAL MISCELLANEOUS MISC: 1.0000 | Freq: Every morning | 8 refills | Status: AC

## 2023-12-06 NOTE — Patient Instructions (Addendum)
 RESUMEN DE LA VISITA:  Daisy Ford [place], hoy tuvo una cita de seguimiento para revisar su tratamiento de regeneracin capilar. Ha experimentado algo de recrecimiento capilar en las sienes y la parte superior del cuero cabelludo, y la cada del cabello ha disminuido desde su ltima visita.  SU PLAN:  -ALOPECIA ANDROGNICA: La alopecia andrognica es un tipo comn de prdida de cabello en las mujeres, a menudo influenciada por factores genticos. Ha mostrado una mejora con recrecimiento capilar en las sienes y el cuero cabelludo, y ignacia disminucin de la cada. El tratamiento tpico que est usando es [place], pero no cura el [place], por lo que es necesario continuar con el tratamiento para Occupational psychologist. Contine aplicando las gotas tpicas diariamente por la maana y tomando sus suplementos actuales. Se ha enviado una nueva receta con ocho resurtidos a su farmacia.  INSTRUCCIONES:  Programe una cita de seguimiento dentro de ocho meses para evaluar su progreso.    VISIT SUMMARY:  Daisy Ford, you had a follow-up appointment today to check on your hair regrowth treatment. You have experienced some regrowth in the temples and at the top of your scalp, and hair shedding has decreased since your last visit.  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 You have shown improvement with regrowth at the temples and scalp, and decreased shedding. The topical treatment you are using is effective but not a cure, so ongoing treatment is necessary to retain your hair. Please continue applying the topical drops daily in the morning and taking your current supplements. A new prescription with eight refills has been sent to your pharmacy.  INSTRUCTIONS:  Please schedule a follow-up appointment in eight months to assess your progress. Important Information  Due to recent  changes in healthcare laws, you may see results of your pathology and/or laboratory studies on MyChart before the doctors have had a chance to review them. We understand that in some cases there may be results that are confusing or concerning to you. Please understand that not all results are received at the same time and often the doctors may need to interpret multiple results in order to provide you with the best plan of care or course of treatment. Therefore, we ask that you please give us  2 business days to thoroughly review all your results before contacting the office for clarification. Should we see a critical lab result, you will be contacted sooner.   If You Need Anything After Your Visit  If you have any questions or concerns for your doctor, please call our main line at [phone] If no one answers, please leave a voicemail as directed and we will return your call as soon as possible. Messages left after 4 pm will be answered the following business day.   You may also send us  a message via MyChart. We typically respond to MyChart messages within 1-2 business days.  For prescription refills, please ask your pharmacy to contact our office. Our fax number is [phone].  If you have an urgent issue when the clinic is closed that cannot wait until the next business day, you can page your doctor at the number below.    Please note that while we do our best to be available for urgent issues outside of office hours, we are not available 24/7.   If you have an urgent issue and are unable to reach us , you  may choose to seek medical care at your doctor's office, retail clinic, urgent care center, or emergency room.  If you have a medical emergency, please immediately call 911 or go to the emergency department. In the event of inclement weather, please call our main line at [phone] for an update on the status of any delays or closures.  Dermatology Medication Tips: Please keep the boxes  that topical medications come in in order to help keep track of the instructions about where and how to use these. Pharmacies typically print the medication instructions only on the boxes and not directly on the medication tubes.   If your medication is too expensive, please contact our office at [phone] or send us  a message through MyChart.   We are unable to tell what your co-pay for medications will be in advance as this is different depending on your insurance coverage. However, we may be able to find a substitute medication at lower cost or fill out paperwork to get insurance to cover a needed medication.   If a prior authorization is required to get your medication covered by your insurance company, please allow us  1-2 business days to complete this process.  Drug prices often vary depending on where the prescription is filled and some pharmacies may offer cheaper prices.  The website www.goodrx.com contains coupons for medications through different pharmacies. The prices here do not account for what the cost may be with help from insurance (it may be cheaper with your insurance), but the website can give you the price if you did not use any insurance.  - You can print the associated coupon and take it with your prescription to the pharmacy.  - You may also stop by our office during regular business hours and pick up a GoodRx coupon card.  - If you need your prescription sent electronically to a different pharmacy, notify our office through [facility] or by phone at [phone]

## 2023-12-06 NOTE — Progress Notes (Signed)
   Follow-Up Visit   Subjective  Daisy Ford is a 33 y.o. female who presents for the following: Androgenetic alopecia follow - last visit 07/28/23. She has noticed some new hair growth. She is using minoxidil/finasteride compound daily. She is also taking Viviscal daily.  Accompanied by interpreter, Will, today  The following portions of the chart were reviewed this encounter and updated as appropriate: medications, allergies, medical history  Review of Systems:  No other skin or systemic complaints except as noted in HPI or Assessment and Plan.  Objective  Well appearing patient in no apparent distress; mood and affect are within normal limits.   A focused examination was performed of the following areas: Scalp  Relevant exam findings are noted in the Assessment and Plan.             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 Decreased shedding. Topical treatment effective but not curative. Genetic factors unchangeable. Ongoing treatment necessary for hair retention. - Continue topical drops daily in the morning. - Send new prescription with eight refills. - Continue current supplements. - Schedule follow-up in eight months to assess progress. ANDROGENIC ALOPECIA   Related Medications Safety Seal Miscellaneous MISC Apply 1 Application topically in the morning. Medication Name: Hormonic Hair Solution Minoxidil 7% Finasteride 0.05%  Return in about 8 months (around 08/04/2024) for Alopecia.  I, Roseline Hutchinson, CMA, am acting as scribe for [employer], DO .   Documentation: I have reviewed the above documentation for accuracy and completeness, and I agree with the above.  Delon Lenis, DO

## 2024-01-05 ENCOUNTER — Other Ambulatory Visit: Payer: Self-pay | Admitting: Pediatrics

## 2024-01-24 ENCOUNTER — Other Ambulatory Visit

## 2024-01-24 DIAGNOSIS — Z131 Encounter for screening for diabetes mellitus: Secondary | ICD-10-CM

## 2024-01-24 DIAGNOSIS — Z130 Encounter for screening for diseases of the blood and blood-forming organs and certain disorders involving the immune mechanism: Secondary | ICD-10-CM

## 2024-01-24 DIAGNOSIS — I951 Orthostatic hypotension: Secondary | ICD-10-CM

## 2024-01-24 DIAGNOSIS — N926 Irregular menstruation, unspecified: Secondary | ICD-10-CM

## 2024-01-24 DIAGNOSIS — Z13220 Encounter for screening for lipoid disorders: Secondary | ICD-10-CM

## 2024-01-24 DIAGNOSIS — R7989 Other specified abnormal findings of blood chemistry: Secondary | ICD-10-CM

## 2024-01-26 ENCOUNTER — Ambulatory Visit: Payer: Self-pay | Admitting: Family Medicine

## 2024-01-26 LAB — LIPID PANEL
Chol/HDL Ratio: 2.4 ratio (ref 0.0–4.4)
Cholesterol, Total: 139 mg/dL (ref 100–199)
HDL: 57 mg/dL (ref >39)
LDL Chol Calc (NIH): 66 mg/dL (ref 0–99)
Triglycerides: 80 mg/dL (ref 0–149)
VLDL Cholesterol Cal: 16 mg/dL (ref 5–40)

## 2024-01-26 LAB — COMPREHENSIVE METABOLIC PANEL WITH GFR
ALT: 14 IU/L (ref 0–32)
AST: 18 IU/L (ref 0–40)
Albumin: 4.7 g/dL (ref 3.9–4.9)
Alkaline Phosphatase: 53 IU/L (ref 41–116)
BUN/Creatinine Ratio: 23 (ref 9–23)
BUN: 16 mg/dL (ref 6–20)
Bilirubin Total: 0.5 mg/dL (ref 0.0–1.2)
CO2: 24 mmol/L (ref 20–29)
Calcium: 9.6 mg/dL (ref 8.7–10.2)
Chloride: 102 mmol/L (ref 96–106)
Creatinine, Ser: 0.69 mg/dL (ref 0.57–1.00)
Globulin, Total: 3.1 g/dL (ref 1.5–4.5)
Glucose: 86 mg/dL (ref 70–99)
Potassium: 4.2 mmol/L (ref 3.5–5.2)
Sodium: 139 mmol/L (ref 134–144)
Total Protein: 7.8 g/dL (ref 6.0–8.5)
eGFR: 119 mL/min/1.73 (ref >59)

## 2024-01-26 LAB — HEMOGLOBIN A1C
Est. average glucose Bld gHb Est-mCnc: 105 mg/dL
Hgb A1c MFr Bld: 5.3 % (ref 4.8–5.6)

## 2024-01-26 LAB — VITAMIN D 25 HYDROXY (VIT D DEFICIENCY, FRACTURES): Vit D, 25-Hydroxy: 23.1 ng/mL — ABNORMAL LOW (ref 30.0–100.0)

## 2024-01-31 ENCOUNTER — Ambulatory Visit (INDEPENDENT_AMBULATORY_CARE_PROVIDER_SITE_OTHER): Admitting: Family Medicine

## 2024-01-31 ENCOUNTER — Encounter: Payer: Self-pay | Admitting: Family Medicine

## 2024-01-31 VITALS — BP 108/67 | HR 80 | Ht 65.0 in | Wt 143.4 lb

## 2024-01-31 DIAGNOSIS — Z23 Encounter for immunization: Secondary | ICD-10-CM

## 2024-01-31 DIAGNOSIS — R002 Palpitations: Secondary | ICD-10-CM | POA: Diagnosis not present

## 2024-01-31 DIAGNOSIS — E041 Nontoxic single thyroid nodule: Secondary | ICD-10-CM

## 2024-01-31 DIAGNOSIS — Z0000 Encounter for general adult medical examination without abnormal findings: Secondary | ICD-10-CM | POA: Diagnosis not present

## 2024-01-31 DIAGNOSIS — R7989 Other specified abnormal findings of blood chemistry: Secondary | ICD-10-CM

## 2024-01-31 DIAGNOSIS — E079 Disorder of thyroid, unspecified: Secondary | ICD-10-CM | POA: Diagnosis not present

## 2024-01-31 NOTE — Patient Instructions (Addendum)
 It was nice to see you today,  We addressed the following topics today: - You can take over-the-counter vitamin D  at a dose of up to 4000 units a day if you wish to treat your low level, but it is not required. - Someone from the imaging center will call you to schedule the thyroid ultrasound. - We will message you with the results of your lab work and the ultrasound. - Please come back for a follow-up appointment in four months.  Have a great day,  Rolan Slain, MD   Fue un placer verte hoy.  Hoy abordamos los siguientes temas: - Puedes tomar vitamina D sin receta en dosis de hasta 4000 unidades al da si deseas tratar tus niveles bajos, pero no es obligatorio. - Alguien del centro de imgenes te llamar para programar la ecografa de tiroides. - Te enviaremos un mensaje con los resultados de tus anlisis de laboratorio y investment banker, operational. - Por favor, regresa para una cita de seguimiento en cuatro meses.  Que tengas un buen da,  Rolan Slain, MD

## 2024-01-31 NOTE — Progress Notes (Unsigned)
 Annual physical  Subjective    Patient ID: Daisy Ford, female    DOB: [birth year]  Age: 33 y.o. MRN: [id number]  Chief Complaint  Patient presents with   Annual Exam   HPI Daisy Ford is a 33 y.o. old female here  for annual exam.   Subjective - C/o swelling/bump on throat, present for >1 month. Reports it is increasing in size and is intermittent. No associated pain, dysphagia, or odynophagia. First noticed while drinking water. - C/o intermittent palpitations, described as a fast rhythm that stops. Sensation lasts ~1 second and occurs ~3 times daily. Reports it is unchanged from prior episodes. Previous workup with cardiac patch monitor was normal. States sensation can be absent for months, then occur regularly for a period. Denies caffeine intake (drinks decaf coffee), energy drinks, or alcohol use.  Medications No current prescription medications. Takes over-the-counter vitamin D  supplement.  PMH, PSH, FH, Social Hx PMHx: Palpitations (prior normal cardiac patch monitor workup), low vitamin D . Social Hx: Denies alcohol use. Drinks decaffeinated coffee. Denies energy drink use.  ROS Constitutional: Denies pain. HEENT: Reports throat swelling. Denies dysphagia, odynophagia. CV: Reports palpitations. GU: Last menstrual period was 01/19/2024. Reports very light bleeding. Has Mirena  IUD in place.   The ASCVD Risk score (Arnett DK, et al., 2019) failed to calculate for the following reasons:   The 2019 ASCVD risk score is only valid for ages 54 to 69  Health Maintenance Due  Topic Date Due   Hepatitis B Vaccines 19-59 Average Risk (1 of 3 - 19+ 3-dose series) Never done   HPV VACCINES (1 - 3-dose SCDM series) Never done   COVID-19 Vaccine (1 - 2025-26 season) Never done      Objective:     BP 108/67   Pulse 80   Ht 5' 5 (1.651 m)   Wt 143 lb 6.4 oz (65 kg)   SpO2 98%   BMI 23.86 kg/m    Physical Exam Gen: alert, oriented HEENT: perrla, eomi, mmm Neck:  palpable lump on the left side over thyroid CV: rrr, no murmur Pulm: lctab. No wheeze or crackles.  GI: soft, nbs.  Nontender to palpation MSK: strength equal b/l. Normal gait Ext: no pedal edema Skin: warm and dry, no rashes Psych: pleasant affect.  Spontaneous speech       Assessment & Plan:   Physical exam, annual  Thyroid nodule -     TSH + free T4 -     US  THYROID; Future  Flu vaccine need -     Flu vaccine trivalent PF, 6mos and older(Flulaval,Afluria,Fluarix,Fluzone)  Thyroid lump Assessment & Plan: Intermittent swelling in the anterior neck for over one month without pain or dysphagia. Exam reveals a palpable small lump. Labs from earlier this year, including TSH, were normal. - Order thyroid ultrasound. - Order TSH level. - F/U in 4 months to review results and reassess. - Patient counseled that ultrasound findings will determine need for further workup, such as biopsy.  Orders: -     US  THYROID; Future  Palpitations Assessment & Plan: Intermittent, brief palpitations, unchanged from previous presentation. Prior workup was negative. Reassured that brief, occasional episodes are often benign. - Continue to monitor.   Low vitamin D  level Assessment & Plan: - Counseled on the option to take OTC vitamin D  up to 4000 units daily, but noted that benefits are unclear and treatment is optional.      Return in about 4 months (around 05/30/2024) for thyroid f/u.  Toribio MARLA Slain, MD

## 2024-02-01 LAB — TSH+FREE T4
Free T4: 1.48 ng/dL (ref 0.82–1.77)
TSH: 1.65 u[IU]/mL (ref 0.450–4.500)

## 2024-02-02 ENCOUNTER — Ambulatory Visit: Payer: Self-pay | Admitting: Family Medicine

## 2024-02-03 DIAGNOSIS — E079 Disorder of thyroid, unspecified: Secondary | ICD-10-CM | POA: Insufficient documentation

## 2024-02-03 NOTE — Assessment & Plan Note (Signed)
-   Counseled on the option to take OTC vitamin D  up to 4000 units daily, but noted that benefits are unclear and treatment is optional.

## 2024-02-03 NOTE — Assessment & Plan Note (Signed)
 Intermittent, brief palpitations, unchanged from previous presentation. Prior workup was negative. Reassured that brief, occasional episodes are often benign. - Continue to monitor.

## 2024-02-03 NOTE — Assessment & Plan Note (Signed)
 Intermittent swelling in the anterior neck for over one month without pain or dysphagia. Exam reveals a palpable small lump. Labs from earlier this year, including TSH, were normal. - Order thyroid ultrasound. - Order TSH level. - F/U in 4 months to review results and reassess. - Patient counseled that ultrasound findings will determine need for further workup, such as biopsy.

## 2024-02-15 ENCOUNTER — Inpatient Hospital Stay: Admission: RE | Admit: 2024-02-15 | Discharge: 2024-02-15 | Attending: Family Medicine

## 2024-02-15 DIAGNOSIS — E079 Disorder of thyroid, unspecified: Secondary | ICD-10-CM

## 2024-02-15 DIAGNOSIS — E041 Nontoxic single thyroid nodule: Secondary | ICD-10-CM

## 2024-02-18 ENCOUNTER — Other Ambulatory Visit: Payer: Self-pay | Admitting: Family Medicine

## 2024-02-18 ENCOUNTER — Encounter: Payer: Self-pay | Admitting: Family Medicine

## 2024-02-18 DIAGNOSIS — E041 Nontoxic single thyroid nodule: Secondary | ICD-10-CM

## 2024-05-30 ENCOUNTER — Ambulatory Visit: Admitting: Family Medicine

## 2024-08-07 ENCOUNTER — Ambulatory Visit: Admitting: Dermatology
